# Patient Record
Sex: Female | Born: 1940 | Race: White | Hispanic: No | Smoking: Former smoker
Health system: Southern US, Community
[De-identification: ages and names within clinical notes are randomized; demographics above are authoritative.]

## PROBLEM LIST (undated history)

## (undated) DIAGNOSIS — M810 Age-related osteoporosis without current pathological fracture: Secondary | ICD-10-CM

## (undated) DIAGNOSIS — M1990 Unspecified osteoarthritis, unspecified site: Secondary | ICD-10-CM

## (undated) DIAGNOSIS — F329 Major depressive disorder, single episode, unspecified: Secondary | ICD-10-CM

## (undated) DIAGNOSIS — E785 Hyperlipidemia, unspecified: Secondary | ICD-10-CM

## (undated) DIAGNOSIS — N63 Unspecified lump in unspecified breast: Secondary | ICD-10-CM

## (undated) DIAGNOSIS — F32A Depression, unspecified: Secondary | ICD-10-CM

## (undated) DIAGNOSIS — K219 Gastro-esophageal reflux disease without esophagitis: Secondary | ICD-10-CM

## (undated) DIAGNOSIS — B019 Varicella without complication: Secondary | ICD-10-CM

## (undated) DIAGNOSIS — I10 Essential (primary) hypertension: Secondary | ICD-10-CM

## (undated) DIAGNOSIS — E119 Type 2 diabetes mellitus without complications: Secondary | ICD-10-CM

## (undated) HISTORY — PX: OTHER SURGICAL HISTORY: SHX169

## (undated) HISTORY — PX: TUBAL LIGATION: SHX77

---
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
## 1972-02-26 HISTORY — PX: ECTOPIC PREGNANCY SURGERY: SHX613

## 1982-02-25 HISTORY — PX: BREAST BIOPSY: SHX20

## 2004-08-03 ENCOUNTER — Ambulatory Visit: Payer: Self-pay | Admitting: Family Medicine

## 2005-08-20 ENCOUNTER — Ambulatory Visit: Payer: Self-pay | Admitting: General Practice

## 2006-09-05 ENCOUNTER — Ambulatory Visit: Payer: Self-pay

## 2006-10-30 ENCOUNTER — Ambulatory Visit: Payer: Self-pay | Admitting: Endocrinology

## 2007-11-25 ENCOUNTER — Ambulatory Visit: Payer: Self-pay | Admitting: Endocrinology

## 2008-11-29 ENCOUNTER — Ambulatory Visit: Payer: Self-pay | Admitting: Family Medicine

## 2009-05-18 ENCOUNTER — Ambulatory Visit: Payer: Self-pay | Admitting: Family Medicine

## 2009-12-19 ENCOUNTER — Ambulatory Visit: Payer: Self-pay | Admitting: Family Medicine

## 2010-12-21 ENCOUNTER — Ambulatory Visit: Payer: Self-pay | Admitting: Family Medicine

## 2011-12-23 ENCOUNTER — Ambulatory Visit: Payer: Self-pay | Admitting: Family Medicine

## 2011-12-26 ENCOUNTER — Ambulatory Visit: Payer: Self-pay | Admitting: Family Medicine

## 2012-12-28 ENCOUNTER — Ambulatory Visit: Payer: Self-pay | Admitting: Family Medicine

## 2012-12-28 DIAGNOSIS — B351 Tinea unguium: Secondary | ICD-10-CM

## 2013-02-28 ENCOUNTER — Emergency Department: Payer: Self-pay | Admitting: Emergency Medicine

## 2013-02-28 LAB — COMPREHENSIVE METABOLIC PANEL
Albumin: 3.3 g/dL — ABNORMAL LOW (ref 3.4–5.0)
Alkaline Phosphatase: 75 U/L
Anion Gap: 5 — ABNORMAL LOW (ref 7–16)
BILIRUBIN TOTAL: 0.3 mg/dL (ref 0.2–1.0)
BUN: 15 mg/dL (ref 7–18)
CHLORIDE: 108 mmol/L — AB (ref 98–107)
CO2: 25 mmol/L (ref 21–32)
CREATININE: 0.88 mg/dL (ref 0.60–1.30)
Calcium, Total: 8.8 mg/dL (ref 8.5–10.1)
EGFR (African American): >60
Glucose: 150 mg/dL — ABNORMAL HIGH (ref 65–99)
OSMOLALITY: 279 (ref 275–301)
POTASSIUM: 4 mmol/L (ref 3.5–5.1)
SGOT(AST): 35 U/L (ref 15–37)
SGPT (ALT): 46 U/L (ref 12–78)
Sodium: 138 mmol/L (ref 136–145)
Total Protein: 7.2 g/dL (ref 6.4–8.2)

## 2013-02-28 LAB — URINALYSIS, COMPLETE
Bacteria: NONE SEEN
Bilirubin,UR: NEGATIVE
Blood: NEGATIVE
Glucose,UR: NEGATIVE mg/dL (ref 0–75)
KETONE: NEGATIVE
Leukocyte Esterase: NEGATIVE
Nitrite: NEGATIVE
PH: 5 (ref 4.5–8.0)
PROTEIN: NEGATIVE
RBC,UR: 1 /HPF (ref 0–5)
Specific Gravity: 1.02 (ref 1.003–1.030)
Squamous Epithelial: NONE SEEN

## 2013-02-28 LAB — CBC
HCT: 39 % (ref 35.0–47.0)
HGB: 13.1 g/dL (ref 12.0–16.0)
MCH: 28.4 pg (ref 26.0–34.0)
MCHC: 33.7 g/dL (ref 32.0–36.0)
MCV: 84 fL (ref 80–100)
Platelet: 213 10*3/uL (ref 150–440)
RBC: 4.62 10*6/uL (ref 3.80–5.20)
RDW: 15.5 % — AB (ref 11.5–14.5)
WBC: 8.6 10*3/uL (ref 3.6–11.0)

## 2013-02-28 LAB — PRO B NATRIURETIC PEPTIDE: B-Type Natriuretic Peptide: 357 pg/mL — ABNORMAL HIGH (ref 0–125)

## 2013-02-28 LAB — TROPONIN I: Troponin-I: <0.02 ng/mL

## 2013-07-27 ENCOUNTER — Ambulatory Visit: Payer: Self-pay | Admitting: Gastroenterology

## 2013-07-27 HISTORY — PX: COLONOSCOPY: SHX174

## 2013-07-29 LAB — PATHOLOGY REPORT

## 2013-12-30 ENCOUNTER — Ambulatory Visit: Payer: Self-pay | Admitting: Family Medicine

## 2015-11-28 ENCOUNTER — Other Ambulatory Visit: Payer: Self-pay | Admitting: Family Medicine

## 2015-11-28 DIAGNOSIS — Z1231 Encounter for screening mammogram for malignant neoplasm of breast: Secondary | ICD-10-CM

## 2015-12-19 ENCOUNTER — Ambulatory Visit
Admission: RE | Admit: 2015-12-19 | Discharge: 2015-12-19 | Disposition: A | Discharge status: Home / Self Care | Payer: Medicare HMO | Source: Ambulatory Visit | Attending: Family Medicine | Admitting: Family Medicine

## 2015-12-19 ENCOUNTER — Encounter: Payer: Self-pay | Admitting: Radiology

## 2015-12-19 DIAGNOSIS — Z1231 Encounter for screening mammogram for malignant neoplasm of breast: Secondary | ICD-10-CM | POA: Insufficient documentation

## 2016-07-16 HISTORY — PX: OTHER SURGICAL HISTORY: SHX169

## 2016-12-20 ENCOUNTER — Other Ambulatory Visit: Payer: Self-pay | Admitting: Family Medicine

## 2016-12-20 DIAGNOSIS — Z1231 Encounter for screening mammogram for malignant neoplasm of breast: Secondary | ICD-10-CM

## 2017-01-02 ENCOUNTER — Ambulatory Visit
Admission: RE | Admit: 2017-01-02 | Discharge: 2017-01-02 | Disposition: A | Discharge status: Home / Self Care | Payer: Medicare HMO | Source: Ambulatory Visit | Attending: Family Medicine | Admitting: Family Medicine

## 2017-01-02 DIAGNOSIS — Z1231 Encounter for screening mammogram for malignant neoplasm of breast: Secondary | ICD-10-CM | POA: Diagnosis present

## 2017-12-08 ENCOUNTER — Other Ambulatory Visit: Payer: Self-pay | Admitting: Family Medicine

## 2017-12-08 DIAGNOSIS — Z1231 Encounter for screening mammogram for malignant neoplasm of breast: Secondary | ICD-10-CM

## 2018-01-07 ENCOUNTER — Ambulatory Visit
Admission: RE | Admit: 2018-01-07 | Discharge: 2018-01-07 | Disposition: A | Discharge status: Home / Self Care | Payer: Medicare Other | Source: Ambulatory Visit | Attending: Family Medicine | Admitting: Family Medicine

## 2018-01-07 DIAGNOSIS — Z1231 Encounter for screening mammogram for malignant neoplasm of breast: Secondary | ICD-10-CM | POA: Diagnosis not present

## 2018-04-20 HISTORY — PX: TOTAL KNEE ARTHROPLASTY: SHX125

## 2018-10-23 ENCOUNTER — Other Ambulatory Visit
Admission: RE | Admit: 2018-10-23 | Discharge: 2018-10-23 | Disposition: A | Discharge status: Home / Self Care | Payer: Medicare Other | Source: Ambulatory Visit | Attending: Gastroenterology | Admitting: Gastroenterology

## 2018-10-23 DIAGNOSIS — K521 Toxic gastroenteritis and colitis: Secondary | ICD-10-CM | POA: Diagnosis present

## 2018-10-23 DIAGNOSIS — R109 Unspecified abdominal pain: Secondary | ICD-10-CM | POA: Diagnosis present

## 2018-10-23 DIAGNOSIS — K529 Noninfective gastroenteritis and colitis, unspecified: Secondary | ICD-10-CM | POA: Diagnosis present

## 2018-10-23 LAB — GASTROINTESTINAL PANEL BY PCR, STOOL (REPLACES STOOL CULTURE)

## 2018-10-23 LAB — C DIFFICILE QUICK SCREEN W PCR REFLEX
C Diff antigen: NEGATIVE
C Diff interpretation: NOT DETECTED
C Diff toxin: NEGATIVE

## 2018-10-27 ENCOUNTER — Inpatient Hospital Stay: Admit: 2018-10-27 | Payer: Medicare Other

## 2018-10-29 LAB — PANCREATIC ELASTASE, FECAL

## 2018-11-03 LAB — CALPROTECTIN, FECAL: Calprotectin, Fecal: 26 ug/g (ref 0–120)

## 2018-12-01 ENCOUNTER — Other Ambulatory Visit: Payer: Self-pay | Admitting: Family Medicine

## 2018-12-01 DIAGNOSIS — Z1231 Encounter for screening mammogram for malignant neoplasm of breast: Secondary | ICD-10-CM

## 2018-12-28 ENCOUNTER — Other Ambulatory Visit: Payer: Self-pay

## 2018-12-28 ENCOUNTER — Other Ambulatory Visit
Admission: RE | Admit: 2018-12-28 | Discharge: 2018-12-28 | Disposition: A | Discharge status: Home / Self Care | Payer: Medicare Other | Source: Ambulatory Visit | Attending: Internal Medicine | Admitting: Internal Medicine

## 2018-12-28 DIAGNOSIS — Z20828 Contact with and (suspected) exposure to other viral communicable diseases: Secondary | ICD-10-CM | POA: Diagnosis not present

## 2018-12-28 DIAGNOSIS — Z01812 Encounter for preprocedural laboratory examination: Secondary | ICD-10-CM | POA: Insufficient documentation

## 2018-12-29 LAB — SARS CORONAVIRUS 2 (TAT 6-24 HRS): SARS Coronavirus 2: NEGATIVE

## 2018-12-30 ENCOUNTER — Encounter: Payer: Self-pay | Admitting: *Deleted

## 2018-12-31 ENCOUNTER — Encounter: Payer: Self-pay | Admitting: *Deleted

## 2018-12-31 ENCOUNTER — Encounter
Admission: RE | Disposition: A | Discharge status: Home / Self Care | Payer: Self-pay | Source: Home / Self Care | Attending: Internal Medicine

## 2018-12-31 ENCOUNTER — Other Ambulatory Visit: Payer: Self-pay

## 2018-12-31 ENCOUNTER — Ambulatory Visit: Payer: Medicare Other | Admitting: Certified Registered Nurse Anesthetist

## 2018-12-31 ENCOUNTER — Ambulatory Visit
Admission: RE | Admit: 2018-12-31 | Discharge: 2018-12-31 | Disposition: A | Discharge status: Home / Self Care | Payer: Medicare Other | Attending: Internal Medicine | Admitting: Internal Medicine

## 2018-12-31 DIAGNOSIS — K640 First degree hemorrhoids: Secondary | ICD-10-CM | POA: Diagnosis not present

## 2018-12-31 DIAGNOSIS — Z87891 Personal history of nicotine dependence: Secondary | ICD-10-CM | POA: Insufficient documentation

## 2018-12-31 DIAGNOSIS — Z7982 Long term (current) use of aspirin: Secondary | ICD-10-CM | POA: Diagnosis not present

## 2018-12-31 DIAGNOSIS — I10 Essential (primary) hypertension: Secondary | ICD-10-CM | POA: Diagnosis not present

## 2018-12-31 DIAGNOSIS — K219 Gastro-esophageal reflux disease without esophagitis: Secondary | ICD-10-CM | POA: Insufficient documentation

## 2018-12-31 DIAGNOSIS — F329 Major depressive disorder, single episode, unspecified: Secondary | ICD-10-CM | POA: Diagnosis not present

## 2018-12-31 DIAGNOSIS — K591 Functional diarrhea: Secondary | ICD-10-CM | POA: Insufficient documentation

## 2018-12-31 DIAGNOSIS — E785 Hyperlipidemia, unspecified: Secondary | ICD-10-CM | POA: Insufficient documentation

## 2018-12-31 DIAGNOSIS — E119 Type 2 diabetes mellitus without complications: Secondary | ICD-10-CM | POA: Insufficient documentation

## 2018-12-31 DIAGNOSIS — Z794 Long term (current) use of insulin: Secondary | ICD-10-CM | POA: Insufficient documentation

## 2018-12-31 DIAGNOSIS — Z79899 Other long term (current) drug therapy: Secondary | ICD-10-CM | POA: Diagnosis not present

## 2018-12-31 DIAGNOSIS — K5730 Diverticulosis of large intestine without perforation or abscess without bleeding: Secondary | ICD-10-CM | POA: Insufficient documentation

## 2018-12-31 DIAGNOSIS — K529 Noninfective gastroenteritis and colitis, unspecified: Secondary | ICD-10-CM | POA: Diagnosis present

## 2018-12-31 DIAGNOSIS — R159 Full incontinence of feces: Secondary | ICD-10-CM | POA: Insufficient documentation

## 2018-12-31 HISTORY — DX: Essential (primary) hypertension: I10

## 2018-12-31 HISTORY — DX: Varicella without complication: B01.9

## 2018-12-31 HISTORY — DX: Type 2 diabetes mellitus without complications: E11.9

## 2018-12-31 HISTORY — DX: Unspecified osteoarthritis, unspecified site: M19.90

## 2018-12-31 HISTORY — DX: Depression, unspecified: F32.A

## 2018-12-31 HISTORY — DX: Age-related osteoporosis without current pathological fracture: M81.0

## 2018-12-31 HISTORY — DX: Gastro-esophageal reflux disease without esophagitis: K21.9

## 2018-12-31 HISTORY — DX: Unspecified lump in unspecified breast: N63.0

## 2018-12-31 HISTORY — DX: Hyperlipidemia, unspecified: E78.5

## 2018-12-31 HISTORY — PX: COLONOSCOPY WITH PROPOFOL: SHX5780

## 2018-12-31 LAB — GLUCOSE, CAPILLARY: Glucose-Capillary: 172 mg/dL — ABNORMAL HIGH (ref 70–99)

## 2018-12-31 SURGERY — COLONOSCOPY WITH PROPOFOL
Anesthesia: General

## 2018-12-31 MED ORDER — PROPOFOL 500 MG/50ML IV EMUL
INTRAVENOUS | Status: AC
  Filled 2018-12-31: qty 50

## 2018-12-31 MED ORDER — PROPOFOL 500 MG/50ML IV EMUL
INTRAVENOUS | Status: DC | PRN
  Administered 2018-12-31: 120 ug/kg/min via INTRAVENOUS

## 2018-12-31 MED ORDER — LIDOCAINE HCL (PF) 2 % IJ SOLN
INTRAMUSCULAR | Status: AC
  Filled 2018-12-31: qty 10

## 2018-12-31 MED ORDER — LIDOCAINE HCL (CARDIAC) PF 100 MG/5ML IV SOSY
PREFILLED_SYRINGE | INTRAVENOUS | Status: DC | PRN
  Administered 2018-12-31: 50 mg via INTRAVENOUS

## 2018-12-31 MED ORDER — SODIUM CHLORIDE 0.9 % IV SOLN
INTRAVENOUS | Status: DC
  Administered 2018-12-31: 1000 mL via INTRAVENOUS

## 2018-12-31 MED ORDER — PROPOFOL 10 MG/ML IV BOLUS
INTRAVENOUS | Status: DC | PRN
  Administered 2018-12-31: 70 mg via INTRAVENOUS

## 2018-12-31 NOTE — H&P (Signed)
Outpatient short stay form Pre-procedure 12/31/2018 10:44 AM Anna Ramos, M.D.  Primary Physician: [name], M.D.  Reason for visit:  Chronic diarrhea  History of present illness: Patient with chronic functional diarrhea with negative random colon biopsies on colonoscopy in 2015. No bleeding.     Current Facility-Administered Medications:  .  0.9 %  sodium chloride infusion, , Intravenous, Continuous, [place], Benay Pike, MD, Last Rate: 20 mL/hr at 12/31/18 1033, 1,000 mL at 12/31/18 1033  Medications Prior to Admission  Medication Sig Dispense Refill Last Dose  . aspirin 81 MG EC tablet Take 81 mg by mouth daily. Swallow whole.     Marland Kitchen CALCIUM CITRATE-VITAMIN D3 PO Take by mouth 2 (two) times daily.     . Cholecalciferol (VITAMIN D-3 PO) Take 1,000 mg by mouth daily.     Marland Kitchen CINNAMON PO Take by mouth 2 (two) times daily.     . Cranberry 400 MG CAPS Take by mouth daily.     . diclofenac sodium (VOLTAREN) 1 % GEL Apply topically 4 (four) times daily.     . DULoxetine (CYMBALTA) 60 MG capsule Take 60 mg by mouth daily.     . Exenatide (BYETTA 10 MCG PEN [place]) Inject into the skin 2 (two) times daily.     Marland Kitchen gabapentin (NEURONTIN) 300 MG capsule Take 300 mg by mouth 3 (three) times daily.     Marland Kitchen glipiZIDE-metformin (METAGLIP) 5-500 MG tablet Take 1 tablet by mouth 2 (two) times daily before a meal.     . hydrochlorothiazide (MICROZIDE) 12.5 MG capsule Take 12.5 mg by mouth daily.     . insulin detemir (LEVEMIR) 100 UNIT/ML injection Inject 20 Units into the skin daily.   12/30/2018 at Unknown time  . insulin NPH-regular Human (70-30) 100 UNIT/ML injection Inject 20 Units into the skin 2 (two) times daily before a meal.     . lisinopril (ZESTRIL) 40 MG tablet Take 40 mg by mouth daily.     . Omega 3 1000 MG CAPS Take by mouth.     Marland Kitchen omeprazole (PRILOSEC) 20 MG capsule Take 20 mg by mouth daily.     . pravastatin (PRAVACHOL) 10 MG tablet Take 10 mg by mouth daily.        No Known  Allergies   Past Medical History:  Diagnosis Date  . Arthritis   . Benign breast lumps   . Chicken pox   . Depression   . Diabetes mellitus without complication ([place])   . GERD (gastroesophageal reflux disease)   . Hyperlipidemia   . Hypertension   . Osteoporosis, post-menopausal     Review of systems:  Otherwise negative.    Physical Exam  Gen: Alert, oriented. Appears stated age.  HEENT: /AT. PERRLA. Lungs: CTA, no wheezes. CV: RR nl S1, S2. Abd: soft, benign, no masses. BS+ Ext: No edema. Pulses 2+    Planned procedures: Proceed with colonoscopy. The patient understands the nature of the planned procedure, indications, risks, alternatives and potential complications including but not limited to bleeding, infection, perforation, damage to internal organs and possible oversedation/side effects from anesthesia. The patient agrees and gives consent to proceed.  Please refer to procedure notes for findings, recommendations and patient disposition/instructions.     Anna Ramos, M.D. Gastroenterology 12/31/2018  10:44 AM

## 2018-12-31 NOTE — Brief Op Note (Signed)
EKG at bedside

## 2018-12-31 NOTE — Anesthesia Preprocedure Evaluation (Addendum)
Anesthesia Evaluation  Patient identified by MRN, date of birth, ID band Patient awake    Reviewed: Allergy & Precautions, H&P , NPO status , Patient's Chart, lab work & pertinent test results, reviewed documented beta blocker date and time   History of Anesthesia Complications Negative for: history of anesthetic complications  Airway Mallampati: II  TM Distance: >3 FB Neck ROM: full    Dental  (+) Dental Advidsory Given, Partial Lower, Missing, Teeth Intact   Pulmonary neg pulmonary ROS, former smoker,    Pulmonary exam normal        Cardiovascular Exercise Tolerance: Good hypertension, (-) angina(-) Past MI and (-) Cardiac Stents Normal cardiovascular exam(-) dysrhythmias (-) Valvular Problems/Murmurs     Neuro/Psych PSYCHIATRIC DISORDERS Depression negative neurological ROS     GI/Hepatic Neg liver ROS, GERD  ,  Endo/Other  diabetes  Renal/GU negative Renal ROS  negative genitourinary   Musculoskeletal   Abdominal   Peds  Hematology negative hematology ROS (+)   Anesthesia Other Findings Past Medical History: No date: Arthritis No date: Benign breast lumps No date: Chicken pox No date: Depression No date: Diabetes mellitus without complication (HCC) No date: GERD (gastroesophageal reflux disease) No date: Hyperlipidemia No date: Hypertension No date: Osteoporosis, post-menopausal   Reproductive/Obstetrics negative OB ROS                            Anesthesia Physical Anesthesia Plan  ASA: III  Anesthesia Plan: General   Post-op Pain Management:    Induction: Intravenous  PONV Risk Score and Plan: 3 and Propofol infusion and TIVA  Airway Management Planned: Natural Airway and Nasal Cannula  Additional Equipment:   Intra-op Plan:   Post-operative Plan:   Informed Consent: I have reviewed the patients History and Physical, chart, labs and discussed the procedure  including the risks, benefits and alternatives for the proposed anesthesia with the patient or authorized representative who has indicated his/her understanding and acceptance.     Dental Advisory Given  Plan Discussed with: Anesthesiologist, CRNA and Surgeon  Anesthesia Plan Comments:         Anesthesia Quick Evaluation

## 2018-12-31 NOTE — Transfer of Care (Signed)
Immediate Anesthesia Transfer of Care Note  Patient: Anna Ramos  Procedure(s) Performed: COLONOSCOPY WITH PROPOFOL (N/A )  Patient Location: PACU and Endoscopy Unit  Anesthesia Type:General  Level of Consciousness: drowsy  Airway & Oxygen Therapy: Patient Spontanous Breathing  Post-op Assessment: Report given to RN and Post -op Vital signs reviewed and stable  Post vital signs: Reviewed and stable  Last Vitals:  Vitals Value Taken Time  BP 145/70 12/31/18 1126  Temp    Pulse 77 12/31/18 1126  Resp 18 12/31/18 1126  SpO2 99 % 12/31/18 1126  Vitals shown include unvalidated device data.  Last Pain:  Vitals:   12/31/18 1009  TempSrc: Tympanic  PainSc: 8          Complications: No apparent anesthesia complications

## 2018-12-31 NOTE — Op Note (Signed)
[facility] Gastroenterology Patient Name: Carri Spillers Procedure Date: 12/31/2018 10:52 AM MRN: [id number] Account #: [id number] Date of Birth: [birth year] Admit Type: Outpatient Age: 78 Room: [facility] ENDO ROOM 2 Gender: Female Note Status: Finalized Procedure:             Colonoscopy Indications:           Functional diarrhea, Fecal incontinence Providers:             Boykin Nearing. Norma Fredrickson MD, MD Referring MD:          Rhona Leavens. Burnett Sheng, MD (Referring MD) Medicines:             Propofol per Anesthesia Complications:         No immediate complications. Procedure:             Pre-Anesthesia Assessment:                        - The risks and benefits of the procedure and the                         sedation options and risks were discussed with the                         patient. All questions were answered and informed                         consent was obtained.                        - Patient identification and proposed procedure were                         verified prior to the procedure by the nurse. The                         procedure was verified in the procedure room.                        - ASA Grade Assessment: III - A patient with severe                         systemic disease.                        - After reviewing the risks and benefits, the patient                         was deemed in satisfactory condition to undergo the                         procedure.                        After obtaining informed consent, the colonoscope was                         passed under direct vision. Throughout the procedure,                         the patient's blood pressure, pulse,  and oxygen                         saturations were monitored continuously. The                         Colonoscope was introduced through the anus and                         advanced to the the cecum, identified by appendiceal                         orifice and ileocecal valve. The  colonoscopy was                         performed without difficulty. The patient tolerated                         the procedure well. The quality of the bowel                         preparation was good. The ileocecal valve, appendiceal                         orifice, and rectum were photographed. Findings:      The digital rectal exam findings include decreased sphincter tone.       Pertinent negatives include no palpable rectal lesions.      Many small-mouthed diverticula were found in the sigmoid colon.      Normal mucosa was found in the entire colon. Biopsies for histology were       taken with a cold forceps from the random colon for evaluation of       microscopic colitis.      Non-bleeding internal hemorrhoids were found during retroflexion. The       hemorrhoids were Grade I (internal hemorrhoids that do not prolapse).      The exam was otherwise without abnormality. Impression:            - Decreased sphincter tone found on digital rectal                         exam.                        - Diverticulosis in the sigmoid colon.                        - Normal mucosa in the entire examined colon. Biopsied.                        - Non-bleeding internal hemorrhoids.                        - The examination was otherwise normal. Recommendation:        - Patient has a contact number available for                         emergencies. The signs and symptoms of potential  delayed complications were discussed with the patient.                         Return to normal activities tomorrow. Written                         discharge instructions were provided to the patient.                        - Resume previous diet.                        - Continue present medications.                        - Await pathology results.                        - No repeat colonoscopy due to current age (68 years                         or older) and the absence of colonic  polyps.                        - Return to physician assistant in 4 weeks. Procedure Code(s):     --- Professional ---                        [phone], Colonoscopy, flexible; with biopsy, single or                         multiple Diagnosis Code(s):     --- Professional ---                        K57.30, Diverticulosis of large intestine without                         perforation or abscess without bleeding                        R15.9, Full incontinence of feces                        K59.1, Functional diarrhea                        K64.0, First degree hemorrhoids                        K62.89, Other specified diseases of anus and rectum CPT copyright 2019 American Medical Association. All rights reserved. The codes documented in this report are preliminary and upon coder review may  be revised to meet current compliance requirements. Efrain Sella MD, MD 12/31/2018 11:29:56 AM This report has been signed electronically. Number of Addenda: 0 Note Initiated On: 12/31/2018 10:52 AM Scope Withdrawal Time: 0 hours 6 minutes 28 seconds  Total Procedure Duration: 0 hours 9 minutes 9 seconds  Estimated Blood Loss:  Estimated blood loss: none.      [facility]

## 2018-12-31 NOTE — Anesthesia Post-op Follow-up Note (Signed)
Anesthesia QCDR form completed.        

## 2018-12-31 NOTE — Anesthesia Postprocedure Evaluation (Signed)
Anesthesia Post Note  Patient: Anna Ramos  Procedure(s) Performed: COLONOSCOPY WITH PROPOFOL (N/A )  Patient location during evaluation: Endoscopy Anesthesia Type: General Level of consciousness: awake and alert Pain management: pain level controlled Vital Signs Assessment: post-procedure vital signs reviewed and stable Respiratory status: spontaneous breathing, nonlabored ventilation, respiratory function stable and patient connected to nasal cannula oxygen Cardiovascular status: blood pressure returned to baseline and stable Postop Assessment: no apparent nausea or vomiting Anesthetic complications: no     Last Vitals:  Vitals:   12/31/18 1156 12/31/18 1206  BP: (!) 139/99 (!) 228/107  Pulse:    Resp:    Temp:    SpO2:      Last Pain:  Vitals:   12/31/18 1206  TempSrc:   PainSc: 0-No pain                 Martha Clan

## 2019-01-01 ENCOUNTER — Encounter: Payer: Self-pay | Admitting: Internal Medicine

## 2019-01-01 LAB — SURGICAL PATHOLOGY

## 2019-01-11 ENCOUNTER — Ambulatory Visit
Admission: RE | Admit: 2019-01-11 | Discharge: 2019-01-11 | Disposition: A | Discharge status: Home / Self Care | Payer: Medicare Other | Source: Ambulatory Visit | Attending: Family Medicine | Admitting: Family Medicine

## 2019-01-11 ENCOUNTER — Other Ambulatory Visit: Payer: Self-pay

## 2019-01-11 DIAGNOSIS — Z1231 Encounter for screening mammogram for malignant neoplasm of breast: Secondary | ICD-10-CM | POA: Insufficient documentation

## 2019-12-06 ENCOUNTER — Other Ambulatory Visit: Payer: Self-pay | Admitting: Family Medicine

## 2019-12-06 DIAGNOSIS — Z1231 Encounter for screening mammogram for malignant neoplasm of breast: Secondary | ICD-10-CM

## 2020-01-12 ENCOUNTER — Ambulatory Visit: Payer: Medicare Other

## 2020-01-18 ENCOUNTER — Encounter (INDEPENDENT_AMBULATORY_CARE_PROVIDER_SITE_OTHER): Payer: Self-pay

## 2020-01-18 ENCOUNTER — Other Ambulatory Visit: Payer: Self-pay

## 2020-01-18 ENCOUNTER — Ambulatory Visit
Admission: RE | Admit: 2020-01-18 | Discharge: 2020-01-18 | Disposition: A | Discharge status: Home / Self Care | Payer: Medicare Other | Source: Ambulatory Visit | Attending: Family Medicine | Admitting: Family Medicine

## 2020-01-18 DIAGNOSIS — Z1231 Encounter for screening mammogram for malignant neoplasm of breast: Secondary | ICD-10-CM | POA: Insufficient documentation

## 2020-10-10 ENCOUNTER — Other Ambulatory Visit
Admission: RE | Admit: 2020-10-10 | Discharge: 2020-10-10 | Disposition: A | Discharge status: Home / Self Care | Payer: Medicare Other | Source: Ambulatory Visit | Attending: Family Medicine | Admitting: Family Medicine

## 2020-10-10 ENCOUNTER — Other Ambulatory Visit: Payer: Self-pay

## 2020-10-10 DIAGNOSIS — Z20822 Contact with and (suspected) exposure to covid-19: Secondary | ICD-10-CM | POA: Insufficient documentation

## 2020-10-10 DIAGNOSIS — Z01812 Encounter for preprocedural laboratory examination: Secondary | ICD-10-CM | POA: Diagnosis present

## 2020-10-10 LAB — SARS CORONAVIRUS 2 (TAT 6-24 HRS): SARS Coronavirus 2: NEGATIVE

## 2020-10-12 ENCOUNTER — Ambulatory Visit: Payer: Medicare Other | Attending: Neurology

## 2020-10-12 DIAGNOSIS — G4733 Obstructive sleep apnea (adult) (pediatric): Secondary | ICD-10-CM | POA: Diagnosis present

## 2020-10-13 ENCOUNTER — Other Ambulatory Visit: Payer: Self-pay

## 2020-12-12 ENCOUNTER — Other Ambulatory Visit: Payer: Self-pay | Admitting: Family Medicine

## 2020-12-12 DIAGNOSIS — Z1231 Encounter for screening mammogram for malignant neoplasm of breast: Secondary | ICD-10-CM

## 2021-01-31 ENCOUNTER — Ambulatory Visit: Payer: Medicare Other

## 2021-07-19 ENCOUNTER — Ambulatory Visit
Admission: RE | Admit: 2021-07-19 | Discharge: 2021-07-19 | Disposition: A | Discharge status: Home / Self Care | Payer: Medicare Other | Source: Ambulatory Visit | Attending: Family Medicine | Admitting: Family Medicine

## 2021-07-19 DIAGNOSIS — Z1231 Encounter for screening mammogram for malignant neoplasm of breast: Secondary | ICD-10-CM | POA: Insufficient documentation

## 2022-05-13 ENCOUNTER — Encounter: Payer: Self-pay | Admitting: Urology

## 2022-05-13 ENCOUNTER — Ambulatory Visit: Payer: Medicare Other | Admitting: Urology

## 2022-05-13 VITALS — BP 144/78 | HR 82 | Ht 62.0 in | Wt 212.0 lb

## 2022-05-13 DIAGNOSIS — N3281 Overactive bladder: Secondary | ICD-10-CM

## 2022-05-13 LAB — URINALYSIS, COMPLETE
Bilirubin, UA: NEGATIVE
Glucose, UA: NEGATIVE
Ketones, UA: NEGATIVE
Nitrite, UA: POSITIVE — AB
RBC, UA: NEGATIVE
Specific Gravity, UA: 1.02 (ref 1.005–1.030)
Urobilinogen, Ur: 0.2 mg/dL (ref 0.2–1.0)
pH, UA: 5.5 (ref 5.0–7.5)

## 2022-05-13 LAB — MICROSCOPIC EXAMINATION

## 2022-05-13 MED ORDER — MIRABEGRON ER 50 MG PO TB24: 50.0000 mg | ORAL_TABLET | Freq: Every day | ORAL | 0 refills | Status: DC

## 2022-05-13 NOTE — Progress Notes (Signed)
05/13/2022 1:51 PM   Anna Ramos 1941/02/02 [id number]  Referring provider: [name], MD [address] [facility] [place],  [place] [address]  Chief Complaint  Patient presents with   New Patient (Initial Visit)   Over Active Bladder    HPI: I was consulted to assess the patient's urinary incontinence.  She leaks with coughing sneezing bending and lifting.  She has urge incontinence.  She does not have bedwetting but describes foot on the floor syndrome wearing 4-5 pads a day moderately wet.  She was being followed by urology at [facility] and her last appointment was near November of last year  She voids every 90 minutes both day and night and often has a good flow  She describes possible bladder cancer but then was not clear in this diagnosis.  She then describes bladder chemotherapy 2 or 3 years ago  She thinks she gets 6 bladder infections a year with pain and chills that respond favorably antibiotics.  She is a insulin-dependent diabetic.  No hysterectomy.  Did not see any bladder medication on her list and the history was not clear  She uses a walker  Spent quite a bit of time looking at the [facility] records and oncology a diagnosis said she has had bladder cancer.  She has had positive urine cultures when she saw urology in 2019.  I could not find good urology notes documenting her cancer.  Bowel movements normal.  No bladder surgery kidney stones.   PMH: Past Medical History:  Diagnosis Date   Arthritis    Benign breast lumps    Chicken pox    Depression    Diabetes mellitus without complication (HCC)    GERD (gastroesophageal reflux disease)    Hyperlipidemia    Hypertension    Osteoporosis, post-menopausal     Surgical History: Past Surgical History:  Procedure Laterality Date   benign breast lump removals     BREAST BIOPSY Bilateral 1984   benign cysts   COLONOSCOPY  07/27/2013   COLONOSCOPY WITH PROPOFOL N/A 12/31/2018    Procedure: COLONOSCOPY WITH PROPOFOL;  Surgeon: [place], Benay Pike, MD;  Location: [facility] ENDOSCOPY;  Service: Gastroenterology;  Laterality: N/A;   ECTOPIC PREGNANCY SURGERY  1974   open reduction tibial shaft fracture  07/16/2016   replacement total knee with resurfacing     TOTAL KNEE ARTHROPLASTY Left 04/20/2018   TUBAL LIGATION      Home Medications:  Allergies as of 05/13/2022   No Known Allergies      Medication List        Accurate as of May 13, 2022  1:51 PM. If you have any questions, ask your nurse or doctor.          aspirin EC 81 MG tablet Take 81 mg by mouth daily. Swallow whole.   BYETTA 10 MCG PEN [place] Inject into the skin 2 (two) times daily.   CALCIUM CITRATE-VITAMIN D3 PO Take by mouth 2 (two) times daily.   CINNAMON PO Take by mouth 2 (two) times daily.   Cranberry 400 MG Caps Take by mouth daily.   diclofenac sodium 1 % Gel Commonly known as: VOLTAREN Apply topically 4 (four) times daily.   DULoxetine 60 MG capsule Commonly known as: CYMBALTA Take 60 mg by mouth daily.   gabapentin 300 MG capsule Commonly known as: NEURONTIN Take 300 mg by mouth 3 (three) times daily.   glipiZIDE-metformin 5-500 MG tablet Commonly known as: METAGLIP Take 1  tablet by mouth 2 (two) times daily before a meal.   hydrochlorothiazide 12.5 MG capsule Commonly known as: MICROZIDE Take 12.5 mg by mouth daily.   insulin detemir 100 UNIT/ML injection Commonly known as: LEVEMIR Inject 20 Units into the skin daily.   insulin NPH-regular Human (70-30) 100 UNIT/ML injection Inject 20 Units into the skin 2 (two) times daily before a meal.   lisinopril 40 MG tablet Commonly known as: ZESTRIL Take 40 mg by mouth daily.   Omega 3 1000 MG Caps Take by mouth.   omeprazole 20 MG capsule Commonly known as: PRILOSEC Take 20 mg by mouth daily.   pravastatin 10 MG tablet Commonly known as: PRAVACHOL Take 10 mg by mouth daily.   VITAMIN D-3 PO Take 1,000 mg by  mouth daily.        Allergies: No Known Allergies  Family History: Family History  Problem Relation Age of Onset   Breast cancer Sister 10   Hypertension Sister    Diabetes Sister    Liver cancer Mother    Osteoporosis Mother    Lupus Mother    Hypertension Father    Diabetes Father    Congenital heart disease Father    Colon polyps Brother    Diabetes Brother    Stroke Brother    Diabetes Maternal Grandmother    Prostate cancer Maternal Grandfather    Diabetes Paternal Grandmother    Heart attack Paternal Grandmother    Diabetes Paternal Grandfather     Social History:  reports that she quit smoking about 4 years ago. Her smoking use included cigarettes. She has a 50.00 pack-year smoking history. She has been exposed to tobacco smoke. She has never used smokeless tobacco. She reports that she does not currently use alcohol. She reports that she does not use drugs.  ROS:                                        Physical Exam: BP (!) 144/78   Pulse 82   Ht 5\' 2"  (1.575 m)   Wt 96.2 kg   BMI 38.78 kg/m   Constitutional:  Alert and oriented, No acute distress. HEENT: [place] AT, moist mucus membranes.  Trachea midline, no masses.   Laboratory Data: Lab Results  Component Value Date   WBC 8.6 02/28/2013   HGB 13.1 02/28/2013   HCT 39.0 02/28/2013   MCV 84 02/28/2013   PLT 213 02/28/2013    Lab Results  Component Value Date   CREATININE 0.88 02/28/2013    No results found for: "PSA"  No results found for: "TESTOSTERONE"  No results found for: "HGBA1C"  Urinalysis    Component Value Date/Time   COLORURINE Yellow 02/28/2013 1606   APPEARANCEUR Clear 02/28/2013 1606   LABSPEC 1.020 02/28/2013 1606   PHURINE 5.0 02/28/2013 1606   GLUCOSEU Negative 02/28/2013 1606   HGBUR Negative 02/28/2013 1606   BILIRUBINUR Negative 02/28/2013 1606   KETONESUR Negative 02/28/2013 1606   PROTEINUR Negative 02/28/2013 1606   NITRITE Negative  02/28/2013 1606   LEUKOCYTESUR Negative 02/28/2013 1606    Pertinent Imaging:   Assessment & Plan: The patient has mixed urinary incontinence but based on the history 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  I am not sure why I could not find notes from [place] but I really could not get any clarity on this  today.  Ideally it would be best go back to [facility] for continuity of care and for safety reasons and this was emphasized to the patient.  She said she has transportation issues and I totally understand and offered for her then to see Dr. Erlene Quan again to take over her potential cancer situation here in this clinic.  The conversation became quite circular.  I would not be surprised if she gets a lot of bladder infections and mentions seeing infectious disease in the past as well.  I will see her back on Myrbetriq 50 mg samples and prescription in combination with the oxybutynin that she is taking in retrospect.  Call if culture positive.  Hopefully if she goes to do she can get a good summary or clearance in terms of cancer form [facility]. Recognizing that other physicians may be examining her I held off on cystoscopy for now.  If she has had bladder cancer cystoscopy should be done by her [place] or perhaps Dr. Erlene Quan in the future with sterilization of the urine prior  1. OAB (overactive bladder)  - Urinalysis, Complete   No follow-ups on file.  Reece Packer, MD  [facility] [address] [place], [place] [address] [phone]

## 2022-06-04 ENCOUNTER — Other Ambulatory Visit: Payer: Self-pay | Admitting: Family Medicine

## 2022-06-04 MED ORDER — MIRABEGRON ER 50 MG PO TB24: 50.0000 mg | ORAL_TABLET | Freq: Every day | ORAL | 3 refills | Status: DC

## 2022-06-04 NOTE — Telephone Encounter (Signed)
Patient called and wants Myrbetriq sent to pharmacy. RX sent.

## 2022-06-11 IMAGING — MG MM DIGITAL SCREENING BILAT W/ TOMO AND CAD
6 of 10 series · 6 of 30 positions shown · non-contrast
Comparison: Previous exam(s).

ACR Breast Density Category a: The breast tissue is almost entirely
fatty.

CLINICAL DATA: Screening.

EXAM:
DIGITAL SCREENING BILATERAL MAMMOGRAM WITH TOMOSYNTHESIS AND CAD
TECHNIQUE: Bilateral screening digital craniocaudal and mediolateral oblique
mammograms were obtained. Bilateral screening digital breast
tomosynthesis was performed. The images were evaluated with
computer-aided detection.

[R CC synth-2D]
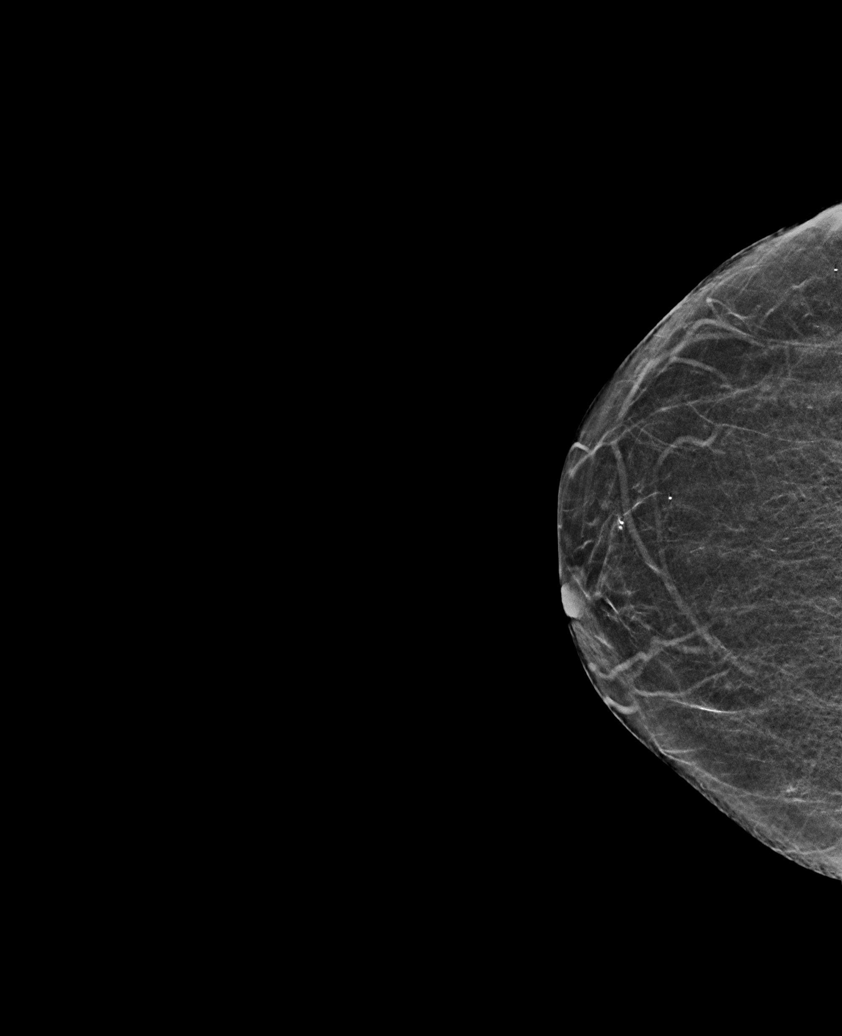

[L CC synth-2D]
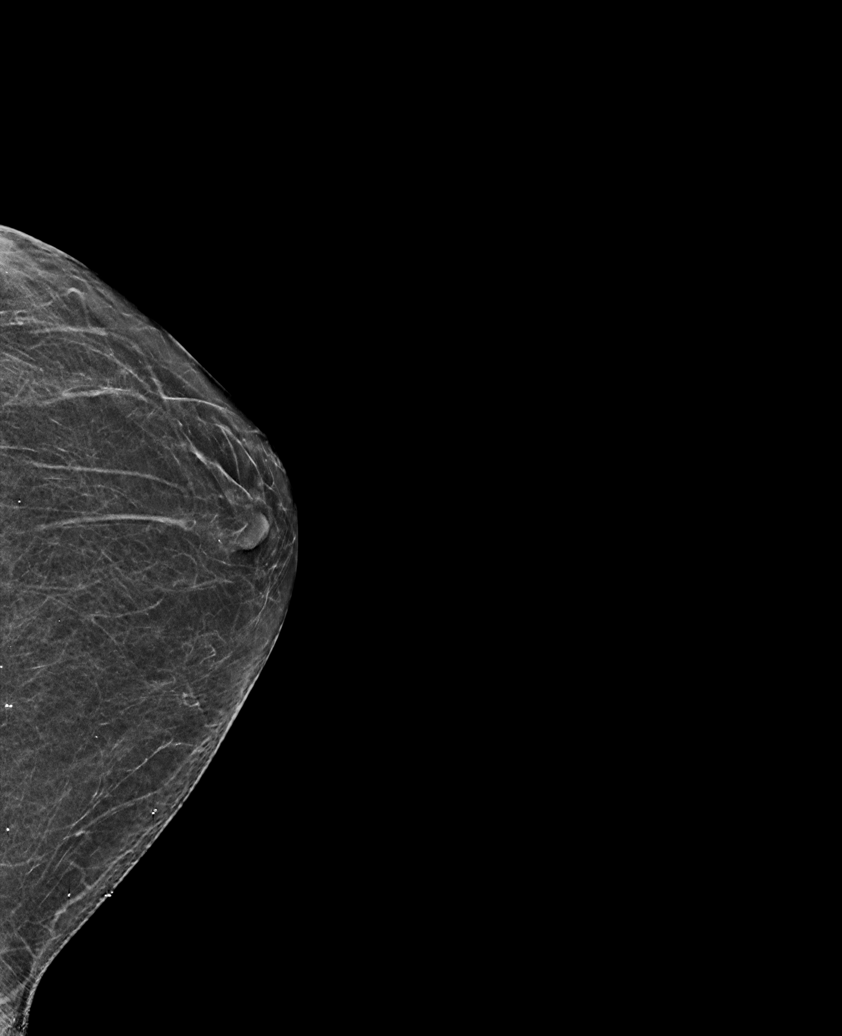

[L MLO synth-2D (1 of 2)]
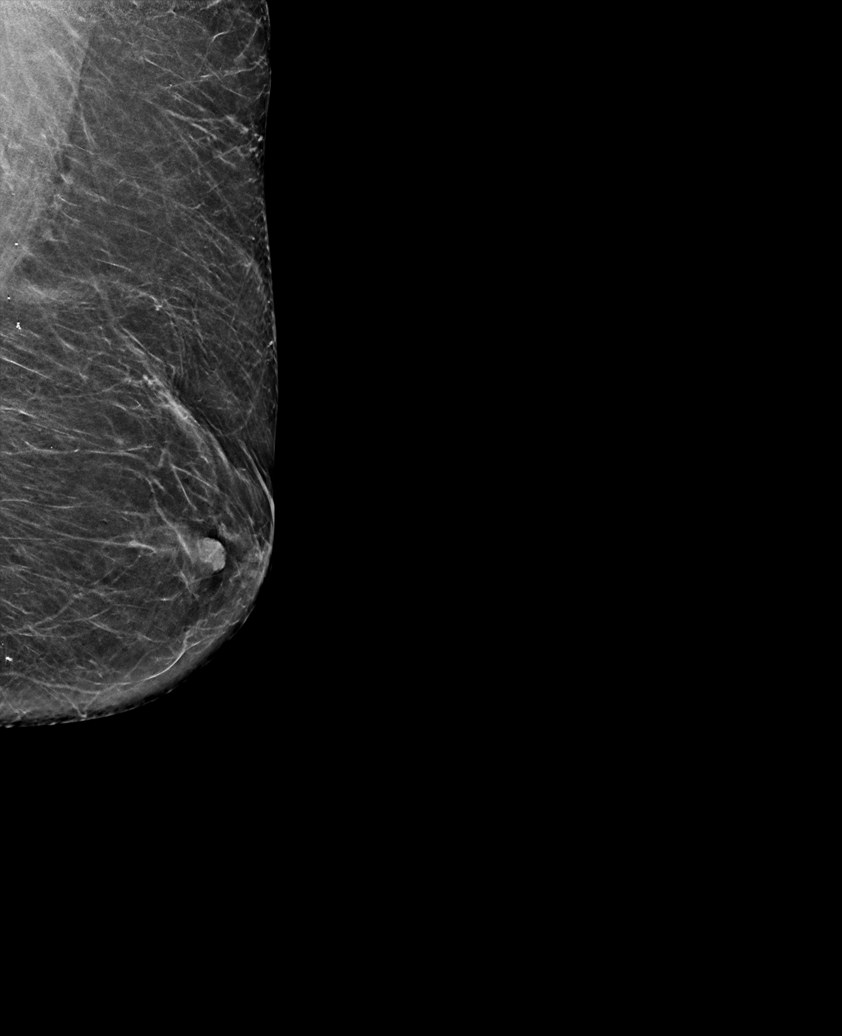

[R MLO synth-2D]
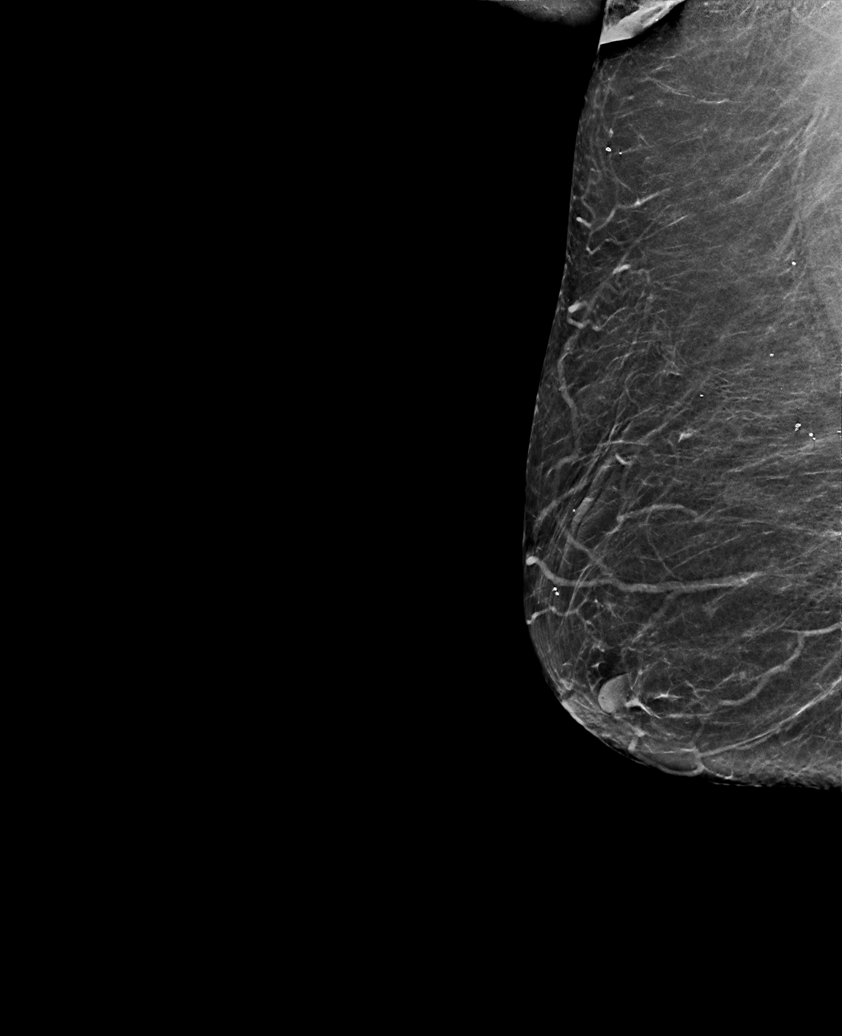

[L MLO synth-2D (2 of 2)]
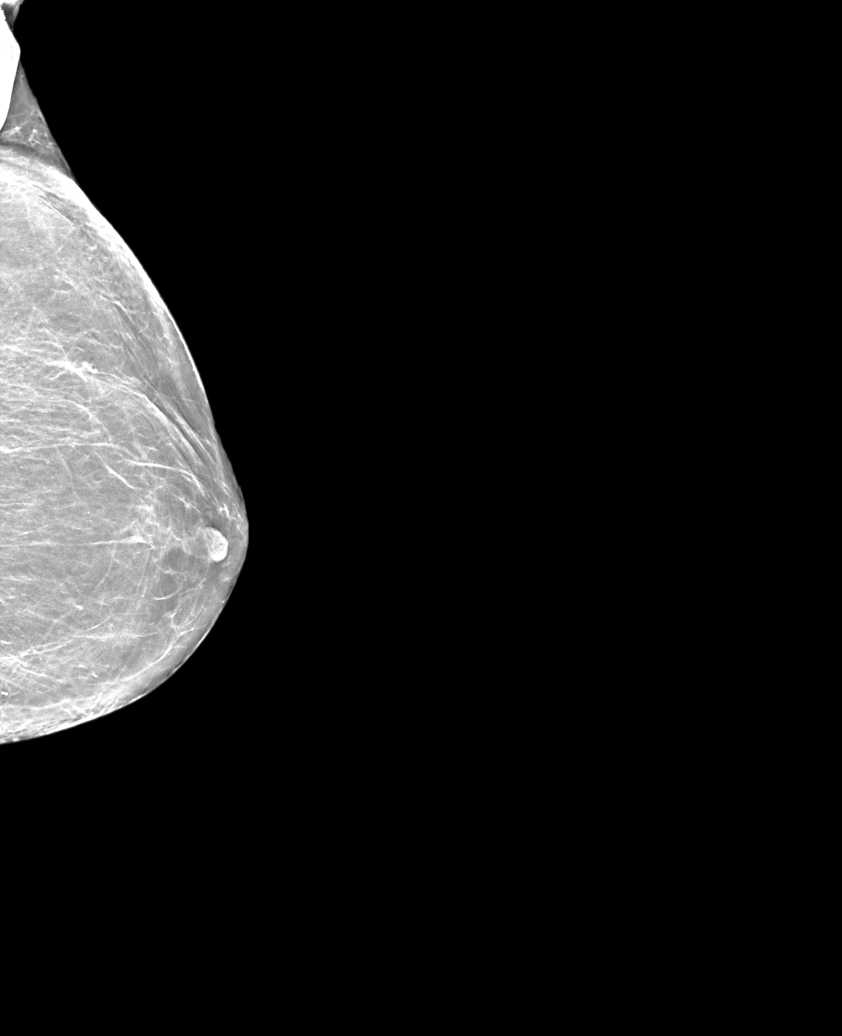

[R CC tomo · tomo slice 25/50.0]
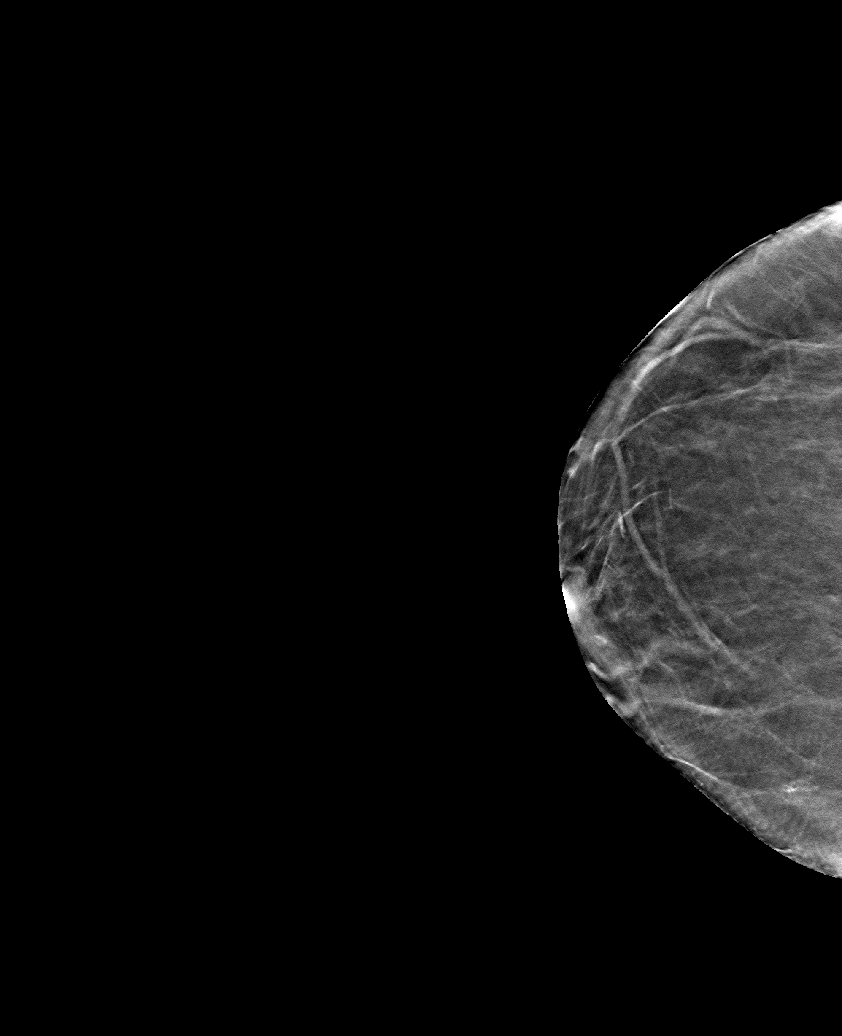

[6 of 30 positions shown; findings below may reference images not displayed]

FINDINGS: There are no findings suspicious for malignancy.
IMPRESSION: No mammographic evidence of malignancy. A result letter of this
screening mammogram will be mailed directly to the patient.

RECOMMENDATION:
Screening mammogram in one year. (Code:0E-3-N98)

BI-RADS CATEGORY  1: Negative.

## 2022-07-08 ENCOUNTER — Ambulatory Visit: Payer: Medicare Other | Admitting: Urology

## 2022-07-08 ENCOUNTER — Encounter: Payer: Self-pay | Admitting: Urology

## 2022-07-08 VITALS — BP 166/84 | HR 88 | Ht 62.0 in | Wt 215.0 lb

## 2022-07-08 DIAGNOSIS — N3281 Overactive bladder: Secondary | ICD-10-CM | POA: Diagnosis not present

## 2022-07-08 LAB — URINALYSIS, COMPLETE
Bilirubin, UA: NEGATIVE
Glucose, UA: NEGATIVE
Ketones, UA: NEGATIVE
Nitrite, UA: POSITIVE — AB
Specific Gravity, UA: 1.025 (ref 1.005–1.030)
Urobilinogen, Ur: 0.2 mg/dL (ref 0.2–1.0)
pH, UA: 5.5 (ref 5.0–7.5)

## 2022-07-08 LAB — MICROSCOPIC EXAMINATION: WBC, UA: >30 /hpf — AB (ref 0–5)

## 2022-07-08 MED ORDER — SOLIFENACIN SUCCINATE 5 MG PO TABS: 5.0000 mg | ORAL_TABLET | Freq: Every day | ORAL | 11 refills | Status: DC

## 2022-07-08 NOTE — Progress Notes (Signed)
07/08/2022 1:05 PM   Wafa Egert October 21, 1940 [id number]  Referring provider: Jerl Mina, MD [address] [facility] [place],  [place] [address]  Chief Complaint  Patient presents with   Follow-up   Over Active Bladder    6 week follow-up    HPI: I was consulted to assess the patient's urinary incontinence.  She leaks with coughing sneezing bending and lifting.  She has urge incontinence.  She does not have bedwetting but describes foot on the floor syndrome wearing 4-5 pads a day moderately wet.   She was being followed by urology at [facility] and her last appointment was near November of last year   She voids every 90 minutes both day and night and often has a good flow   She describes possible bladder cancer but then was not clear in this diagnosis.  She then describes bladder chemotherapy 2 or 3 years ago   She thinks she gets 6 bladder infections a year with pain and chills that respond favorably antibiotics.   She is a insulin-dependent diabetic.  No hysterectomy.  Did not see any bladder medication on her list and the history was not clear   She uses a walker   Spent quite a bit of time looking at the [facility] medical records and oncology a diagnosis said she has had bladder cancer.  She has had positive urine cultures when she saw urology in 2019.  I could not find good urology notes documenting her cancer.    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  I am not sure why I could not find notes from [facility] but I really could not get any clarity on this today.  Ideally it would be best go back to [facility] for continuity of care and for safety reasons and this was emphasized to the patient.  She said she has transportation issues and I totally understand and offered for her then to see Dr. Apolinar Junes again to take over her potential cancer situation here in this clinic.  The  conversation became quite circular.  I would not be surprised if she gets a lot of bladder infections and mentions seeing infectious disease in the past as well.  I will see her back on Myrbetriq 50 mg samples and prescription in combination with the oxybutynin that she is taking in retrospect.  Call if culture positive.  Hopefully if she goes to do she can get a good summary or clearance in terms of cancer form [facility]. Recognizing that other physicians may be examining her I held off on cystoscopy for now.  If she has had bladder cancer cystoscopy should be done by her [facility] physician or perhaps Dr. Apolinar Junes in the future with sterilization of the urine prior   Today Patient was nearly dry on the Myrbetriq in combination with oxybutynin but it was $800.  Now she is leaking again.  Clinically not infected.  She states she cannot go back to [facility] for bladder cancer checkups     PMH: Past Medical History:  Diagnosis Date   Arthritis    Benign breast lumps    Chicken pox    Depression    Diabetes mellitus without complication (HCC)    GERD (gastroesophageal reflux disease)    Hyperlipidemia    Hypertension    Osteoporosis, post-menopausal     Surgical History: Past Surgical History:  Procedure Laterality Date  benign breast lump removals     BREAST BIOPSY Bilateral 1984   benign cysts   COLONOSCOPY  07/27/2013   COLONOSCOPY WITH PROPOFOL N/A 12/31/2018   Procedure: COLONOSCOPY WITH PROPOFOL;  Surgeon: Toledo, Boykin Nearing, MD;  Location: [facility] ENDOSCOPY;  Service: Gastroenterology;  Laterality: N/A;   ECTOPIC PREGNANCY SURGERY  1974   open reduction tibial shaft fracture  07/16/2016   replacement total knee with resurfacing     TOTAL KNEE ARTHROPLASTY Left 04/20/2018   TUBAL LIGATION      Home Medications:  Allergies as of 07/08/2022   No Known Allergies      Medication List        Accurate as of Jul 08, 2022  1:05 PM. If you have any questions, ask your nurse or doctor.           aspirin EC 81 MG tablet Take 81 mg by mouth daily. Swallow whole.   BYETTA 10 MCG PEN [place] Inject into the skin 2 (two) times daily.   CALCIUM CITRATE-VITAMIN D3 PO Take by mouth 2 (two) times daily.   CINNAMON PO Take by mouth 2 (two) times daily.   Cranberry 400 MG Caps Take by mouth daily.   diclofenac sodium 1 % Gel Commonly known as: VOLTAREN Apply topically 4 (four) times daily.   DULoxetine 60 MG capsule Commonly known as: CYMBALTA Take 60 mg by mouth daily.   gabapentin 300 MG capsule Commonly known as: NEURONTIN Take 300 mg by mouth 3 (three) times daily.   glipiZIDE-metformin 5-500 MG tablet Commonly known as: METAGLIP Take 1 tablet by mouth 2 (two) times daily before a meal.   hydrochlorothiazide 12.5 MG capsule Commonly known as: MICROZIDE Take 12.5 mg by mouth daily.   insulin detemir 100 UNIT/ML injection Commonly known as: LEVEMIR Inject 20 Units into the skin daily.   insulin NPH-regular Human (70-30) 100 UNIT/ML injection Inject 20 Units into the skin 2 (two) times daily before a meal.   lisinopril 40 MG tablet Commonly known as: ZESTRIL Take 40 mg by mouth daily.   mirabegron ER 50 MG Tb24 tablet Commonly known as: Myrbetriq Take 1 tablet (50 mg total) by mouth daily.   Omega 3 1000 MG Caps Take by mouth.   omeprazole 20 MG capsule Commonly known as: PRILOSEC Take 20 mg by mouth daily.   pravastatin 10 MG tablet Commonly known as: PRAVACHOL Take 10 mg by mouth daily.   VITAMIN D-3 PO Take 1,000 mg by mouth daily.        Allergies: No Known Allergies  Family History: Family History  Problem Relation Age of Onset   Breast cancer Sister 35   Hypertension Sister    Diabetes Sister    Liver cancer Mother    Osteoporosis Mother    Lupus Mother    Hypertension Father    Diabetes Father    Congenital heart disease Father    Colon polyps Brother    Diabetes Brother    Stroke Brother    Diabetes Maternal Grandmother     Prostate cancer Maternal Grandfather    Diabetes Paternal Grandmother    Heart attack Paternal Grandmother    Diabetes Paternal Grandfather     Social History:  reports that she quit smoking about 4 years ago. Her smoking use included cigarettes. She has a 50.00 pack-year smoking history. She has been exposed to tobacco smoke. She has never used smokeless tobacco. She reports that she does not currently use alcohol. She reports that  she does not use drugs.  ROS:                                        Physical Exam: BP (!) 166/84   Pulse 88   Ht 5\' 2"  (1.575 m)   Wt 97.5 kg   BMI 39.32 kg/m   Constitutional:  Alert and oriented, No acute distress. HEENT: [place] AT, moist mucus membranes.  Trachea midline, no masses.   Laboratory Data: Lab Results  Component Value Date   WBC 8.6 02/28/2013   HGB 13.1 02/28/2013   HCT 39.0 02/28/2013   MCV 84 02/28/2013   PLT 213 02/28/2013    Lab Results  Component Value Date   CREATININE 0.88 02/28/2013    No results found for: "PSA"  No results found for: "TESTOSTERONE"  No results found for: "HGBA1C"  Urinalysis    Component Value Date/Time   COLORURINE Yellow 02/28/2013 1606   APPEARANCEUR Hazy (A) 05/13/2022 1349   LABSPEC 1.020 02/28/2013 1606   PHURINE 5.0 02/28/2013 1606   GLUCOSEU Negative 05/13/2022 1349   GLUCOSEU Negative 02/28/2013 1606   HGBUR Negative 02/28/2013 1606   BILIRUBINUR Negative 05/13/2022 1349   BILIRUBINUR Negative 02/28/2013 1606   KETONESUR Negative 02/28/2013 1606   PROTEINUR 3+ (A) 05/13/2022 1349   PROTEINUR Negative 02/28/2013 1606   NITRITE Positive (A) 05/13/2022 1349   NITRITE Negative 02/28/2013 1606   LEUKOCYTESUR Trace (A) 05/13/2022 1349   LEUKOCYTESUR Negative 02/28/2013 1606    Pertinent Imaging:   Assessment & Plan: Reassess in 6 weeks on Vesicare 5 mg 30 x 11 in combination with the oxybutynin.  If she does very well I can always stop the oxybutynin.  See  Dr. Apolinar Junes for ongoing care for superficial bladder cancer  1. OAB (overactive bladder)  - Urinalysis, Complete   No follow-ups on file.  Martina Sinner, MD  [facility] [address], [address] [place], [place] [address] [phone]

## 2022-07-08 NOTE — Addendum Note (Signed)
Addended by: Sueanne Margarita on: 07/08/2022 01:46 PM   Modules accepted: Orders

## 2022-07-11 LAB — CULTURE, URINE COMPREHENSIVE

## 2022-07-12 ENCOUNTER — Telehealth: Payer: Self-pay

## 2022-07-12 MED ORDER — NITROFURANTOIN MACROCRYSTAL 100 MG PO CAPS: 100.0000 mg | ORAL_CAPSULE | Freq: Two times a day (BID) | ORAL | 0 refills | Status: DC

## 2022-07-12 NOTE — Telephone Encounter (Signed)
-----   Message from Alfredo Martinez, MD sent at 07/12/2022  8:32 AM EDT ----- Macrodantin 100 mg bid for 7 days ----- Message ----- From: Consuella Lose, CMA Sent: 07/11/2022   4:41 PM EDT To: Alfredo Martinez, MD   ----- Message ----- From: Interface, [facility] Lab Results In Sent: 07/08/2022   1:36 PM EDT To: Jennette Kettle Clinical

## 2022-07-12 NOTE — Telephone Encounter (Signed)
Patient advised and RX sent in. 

## 2022-08-14 ENCOUNTER — Ambulatory Visit: Payer: Medicare Other | Admitting: Urology

## 2022-08-14 VITALS — BP 142/79 | HR 69 | Ht 62.0 in | Wt 212.5 lb

## 2022-08-14 DIAGNOSIS — Z87440 Personal history of urinary (tract) infections: Secondary | ICD-10-CM | POA: Diagnosis not present

## 2022-08-14 DIAGNOSIS — N3281 Overactive bladder: Secondary | ICD-10-CM | POA: Diagnosis not present

## 2022-08-14 NOTE — Progress Notes (Signed)
Anna Ramos presents for an office/procedure visit. BP today is 150/74. She is complaint with BP medication. Greater than 140/90. Provider  notified. Pt advised to follow up with PCP. Pt voiced understanding.
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
## 2022-09-02 ENCOUNTER — Ambulatory Visit: Payer: Medicare Other | Admitting: Urology

## 2022-09-09 ENCOUNTER — Other Ambulatory Visit: Payer: Self-pay | Admitting: Family Medicine

## 2022-09-09 DIAGNOSIS — Z1231 Encounter for screening mammogram for malignant neoplasm of breast: Secondary | ICD-10-CM

## 2022-09-23 ENCOUNTER — Ambulatory Visit: Payer: Medicare Other | Admitting: Urology

## 2022-09-23 ENCOUNTER — Encounter: Payer: Self-pay | Admitting: Urology

## 2022-09-23 DIAGNOSIS — N3020 Other chronic cystitis without hematuria: Secondary | ICD-10-CM

## 2022-09-23 DIAGNOSIS — N3281 Overactive bladder: Secondary | ICD-10-CM

## 2022-09-23 MED ORDER — TRIMETHOPRIM 100 MG PO TABS: 100.0000 mg | ORAL_TABLET | Freq: Every day | ORAL | 11 refills | Status: DC
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
## 2022-10-01 ENCOUNTER — Ambulatory Visit
Admission: RE | Admit: 2022-10-01 | Discharge: 2022-10-01 | Disposition: A | Discharge status: Home / Self Care | Payer: Medicare Other | Source: Ambulatory Visit | Attending: Family Medicine | Admitting: Family Medicine

## 2022-10-01 DIAGNOSIS — Z1231 Encounter for screening mammogram for malignant neoplasm of breast: Secondary | ICD-10-CM | POA: Insufficient documentation

## 2022-11-29 DIAGNOSIS — M069 Rheumatoid arthritis, unspecified: Secondary | ICD-10-CM | POA: Diagnosis not present

## 2022-11-29 DIAGNOSIS — M810 Age-related osteoporosis without current pathological fracture: Secondary | ICD-10-CM | POA: Diagnosis not present

## 2022-11-29 DIAGNOSIS — E1142 Type 2 diabetes mellitus with diabetic polyneuropathy: Secondary | ICD-10-CM | POA: Diagnosis not present

## 2022-11-29 DIAGNOSIS — M1990 Unspecified osteoarthritis, unspecified site: Secondary | ICD-10-CM | POA: Diagnosis not present

## 2022-11-29 DIAGNOSIS — M47812 Spondylosis without myelopathy or radiculopathy, cervical region: Secondary | ICD-10-CM | POA: Diagnosis not present

## 2022-11-29 DIAGNOSIS — I10 Essential (primary) hypertension: Secondary | ICD-10-CM | POA: Diagnosis not present

## 2022-11-29 DIAGNOSIS — F32A Depression, unspecified: Secondary | ICD-10-CM | POA: Diagnosis not present

## 2022-11-29 DIAGNOSIS — E538 Deficiency of other specified B group vitamins: Secondary | ICD-10-CM | POA: Diagnosis not present

## 2022-11-29 DIAGNOSIS — E7849 Other hyperlipidemia: Secondary | ICD-10-CM | POA: Diagnosis not present

## 2022-12-04 DIAGNOSIS — M069 Rheumatoid arthritis, unspecified: Secondary | ICD-10-CM | POA: Diagnosis not present

## 2022-12-04 DIAGNOSIS — E1142 Type 2 diabetes mellitus with diabetic polyneuropathy: Secondary | ICD-10-CM | POA: Diagnosis not present

## 2022-12-04 DIAGNOSIS — E538 Deficiency of other specified B group vitamins: Secondary | ICD-10-CM | POA: Diagnosis not present

## 2022-12-04 DIAGNOSIS — M47812 Spondylosis without myelopathy or radiculopathy, cervical region: Secondary | ICD-10-CM | POA: Diagnosis not present

## 2022-12-04 DIAGNOSIS — M810 Age-related osteoporosis without current pathological fracture: Secondary | ICD-10-CM | POA: Diagnosis not present

## 2022-12-04 DIAGNOSIS — E7849 Other hyperlipidemia: Secondary | ICD-10-CM | POA: Diagnosis not present

## 2022-12-04 DIAGNOSIS — I10 Essential (primary) hypertension: Secondary | ICD-10-CM | POA: Diagnosis not present

## 2022-12-04 DIAGNOSIS — M1990 Unspecified osteoarthritis, unspecified site: Secondary | ICD-10-CM | POA: Diagnosis not present

## 2022-12-04 DIAGNOSIS — F32A Depression, unspecified: Secondary | ICD-10-CM | POA: Diagnosis not present

## 2022-12-11 DIAGNOSIS — E538 Deficiency of other specified B group vitamins: Secondary | ICD-10-CM | POA: Diagnosis not present

## 2022-12-11 DIAGNOSIS — M1990 Unspecified osteoarthritis, unspecified site: Secondary | ICD-10-CM | POA: Diagnosis not present

## 2022-12-11 DIAGNOSIS — M810 Age-related osteoporosis without current pathological fracture: Secondary | ICD-10-CM | POA: Diagnosis not present

## 2022-12-11 DIAGNOSIS — M069 Rheumatoid arthritis, unspecified: Secondary | ICD-10-CM | POA: Diagnosis not present

## 2022-12-11 DIAGNOSIS — F32A Depression, unspecified: Secondary | ICD-10-CM | POA: Diagnosis not present

## 2022-12-11 DIAGNOSIS — M47812 Spondylosis without myelopathy or radiculopathy, cervical region: Secondary | ICD-10-CM | POA: Diagnosis not present

## 2022-12-11 DIAGNOSIS — I10 Essential (primary) hypertension: Secondary | ICD-10-CM | POA: Diagnosis not present

## 2022-12-11 DIAGNOSIS — E1142 Type 2 diabetes mellitus with diabetic polyneuropathy: Secondary | ICD-10-CM | POA: Diagnosis not present

## 2022-12-11 DIAGNOSIS — E7849 Other hyperlipidemia: Secondary | ICD-10-CM | POA: Diagnosis not present

## 2022-12-18 DIAGNOSIS — I10 Essential (primary) hypertension: Secondary | ICD-10-CM | POA: Diagnosis not present

## 2022-12-18 DIAGNOSIS — E7849 Other hyperlipidemia: Secondary | ICD-10-CM | POA: Diagnosis not present

## 2022-12-18 DIAGNOSIS — M810 Age-related osteoporosis without current pathological fracture: Secondary | ICD-10-CM | POA: Diagnosis not present

## 2022-12-18 DIAGNOSIS — E1142 Type 2 diabetes mellitus with diabetic polyneuropathy: Secondary | ICD-10-CM | POA: Diagnosis not present

## 2022-12-18 DIAGNOSIS — M069 Rheumatoid arthritis, unspecified: Secondary | ICD-10-CM | POA: Diagnosis not present

## 2022-12-18 DIAGNOSIS — E538 Deficiency of other specified B group vitamins: Secondary | ICD-10-CM | POA: Diagnosis not present

## 2022-12-18 DIAGNOSIS — M47812 Spondylosis without myelopathy or radiculopathy, cervical region: Secondary | ICD-10-CM | POA: Diagnosis not present

## 2022-12-18 DIAGNOSIS — F32A Depression, unspecified: Secondary | ICD-10-CM | POA: Diagnosis not present

## 2022-12-18 DIAGNOSIS — M1990 Unspecified osteoarthritis, unspecified site: Secondary | ICD-10-CM | POA: Diagnosis not present

## 2022-12-23 ENCOUNTER — Encounter: Payer: Self-pay | Admitting: Urology

## 2022-12-23 ENCOUNTER — Ambulatory Visit: Payer: Medicare HMO | Admitting: Urology

## 2022-12-23 VITALS — BP 145/80 | HR 73 | Ht 60.0 in | Wt 220.0 lb

## 2022-12-23 DIAGNOSIS — N3020 Other chronic cystitis without hematuria: Secondary | ICD-10-CM

## 2022-12-23 DIAGNOSIS — N3941 Urge incontinence: Secondary | ICD-10-CM | POA: Diagnosis not present

## 2022-12-23 LAB — MICROSCOPIC EXAMINATION: WBC, UA: >30 /[HPF] — AB (ref 0–5)

## 2022-12-23 LAB — URINALYSIS, COMPLETE
Bilirubin, UA: NEGATIVE
Glucose, UA: NEGATIVE
Ketones, UA: NEGATIVE
Nitrite, UA: NEGATIVE
Specific Gravity, UA: 1.025 (ref 1.005–1.030)
Urobilinogen, Ur: 0.2 mg/dL (ref 0.2–1.0)
pH, UA: 5.5 (ref 5.0–7.5)

## 2022-12-23 MED ORDER — SOLIFENACIN SUCCINATE 5 MG PO TABS: 5.0000 mg | ORAL_TABLET | Freq: Every day | ORAL | 3 refills | Status: DC

## 2022-12-23 MED ORDER — TRIMETHOPRIM 100 MG PO TABS: 100.0000 mg | ORAL_TABLET | Freq: Every day | ORAL | 3 refills | Status: DC

## 2022-12-23 MED ORDER — OXYBUTYNIN CHLORIDE ER 10 MG PO TB24: 10.0000 mg | ORAL_TABLET | Freq: Every day | ORAL | 3 refills | Status: DC

## 2022-12-23 NOTE — Addendum Note (Signed)
Addended by: Sueanne Margarita on: 12/23/2022 01:28 PM   Modules accepted: Orders
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
## 2022-12-26 LAB — CULTURE, URINE COMPREHENSIVE

## 2022-12-27 DIAGNOSIS — E7849 Other hyperlipidemia: Secondary | ICD-10-CM | POA: Diagnosis not present

## 2022-12-27 DIAGNOSIS — M069 Rheumatoid arthritis, unspecified: Secondary | ICD-10-CM | POA: Diagnosis not present

## 2022-12-27 DIAGNOSIS — E538 Deficiency of other specified B group vitamins: Secondary | ICD-10-CM | POA: Diagnosis not present

## 2022-12-27 DIAGNOSIS — F32A Depression, unspecified: Secondary | ICD-10-CM | POA: Diagnosis not present

## 2022-12-27 DIAGNOSIS — M810 Age-related osteoporosis without current pathological fracture: Secondary | ICD-10-CM | POA: Diagnosis not present

## 2022-12-27 DIAGNOSIS — M47812 Spondylosis without myelopathy or radiculopathy, cervical region: Secondary | ICD-10-CM | POA: Diagnosis not present

## 2022-12-27 DIAGNOSIS — M1990 Unspecified osteoarthritis, unspecified site: Secondary | ICD-10-CM | POA: Diagnosis not present

## 2022-12-27 DIAGNOSIS — E1142 Type 2 diabetes mellitus with diabetic polyneuropathy: Secondary | ICD-10-CM | POA: Diagnosis not present

## 2022-12-27 DIAGNOSIS — I10 Essential (primary) hypertension: Secondary | ICD-10-CM | POA: Diagnosis not present

## 2023-01-03 DIAGNOSIS — F32A Depression, unspecified: Secondary | ICD-10-CM | POA: Diagnosis not present

## 2023-01-03 DIAGNOSIS — M069 Rheumatoid arthritis, unspecified: Secondary | ICD-10-CM | POA: Diagnosis not present

## 2023-01-03 DIAGNOSIS — E538 Deficiency of other specified B group vitamins: Secondary | ICD-10-CM | POA: Diagnosis not present

## 2023-01-03 DIAGNOSIS — E7849 Other hyperlipidemia: Secondary | ICD-10-CM | POA: Diagnosis not present

## 2023-01-03 DIAGNOSIS — E1142 Type 2 diabetes mellitus with diabetic polyneuropathy: Secondary | ICD-10-CM | POA: Diagnosis not present

## 2023-01-03 DIAGNOSIS — M810 Age-related osteoporosis without current pathological fracture: Secondary | ICD-10-CM | POA: Diagnosis not present

## 2023-01-03 DIAGNOSIS — M1990 Unspecified osteoarthritis, unspecified site: Secondary | ICD-10-CM | POA: Diagnosis not present

## 2023-01-03 DIAGNOSIS — I10 Essential (primary) hypertension: Secondary | ICD-10-CM | POA: Diagnosis not present

## 2023-01-03 DIAGNOSIS — M47812 Spondylosis without myelopathy or radiculopathy, cervical region: Secondary | ICD-10-CM | POA: Diagnosis not present

## 2023-01-09 DIAGNOSIS — E119 Type 2 diabetes mellitus without complications: Secondary | ICD-10-CM | POA: Diagnosis not present

## 2023-01-09 DIAGNOSIS — H524 Presbyopia: Secondary | ICD-10-CM | POA: Diagnosis not present

## 2023-01-15 DIAGNOSIS — M810 Age-related osteoporosis without current pathological fracture: Secondary | ICD-10-CM | POA: Diagnosis not present

## 2023-01-15 DIAGNOSIS — M1990 Unspecified osteoarthritis, unspecified site: Secondary | ICD-10-CM | POA: Diagnosis not present

## 2023-01-15 DIAGNOSIS — E538 Deficiency of other specified B group vitamins: Secondary | ICD-10-CM | POA: Diagnosis not present

## 2023-01-15 DIAGNOSIS — E1142 Type 2 diabetes mellitus with diabetic polyneuropathy: Secondary | ICD-10-CM | POA: Diagnosis not present

## 2023-01-15 DIAGNOSIS — F32A Depression, unspecified: Secondary | ICD-10-CM | POA: Diagnosis not present

## 2023-01-15 DIAGNOSIS — M47812 Spondylosis without myelopathy or radiculopathy, cervical region: Secondary | ICD-10-CM | POA: Diagnosis not present

## 2023-01-15 DIAGNOSIS — E7849 Other hyperlipidemia: Secondary | ICD-10-CM | POA: Diagnosis not present

## 2023-01-15 DIAGNOSIS — M069 Rheumatoid arthritis, unspecified: Secondary | ICD-10-CM | POA: Diagnosis not present

## 2023-01-15 DIAGNOSIS — I10 Essential (primary) hypertension: Secondary | ICD-10-CM | POA: Diagnosis not present

## 2023-01-29 DIAGNOSIS — R202 Paresthesia of skin: Secondary | ICD-10-CM | POA: Diagnosis not present

## 2023-01-29 DIAGNOSIS — G5793 Unspecified mononeuropathy of bilateral lower limbs: Secondary | ICD-10-CM | POA: Diagnosis not present

## 2023-01-29 DIAGNOSIS — R200 Anesthesia of skin: Secondary | ICD-10-CM | POA: Diagnosis not present

## 2023-02-18 DIAGNOSIS — I10 Essential (primary) hypertension: Secondary | ICD-10-CM | POA: Diagnosis not present

## 2023-02-18 DIAGNOSIS — Z6841 Body Mass Index (BMI) 40.0 and over, adult: Secondary | ICD-10-CM | POA: Diagnosis not present

## 2023-02-18 DIAGNOSIS — N179 Acute kidney failure, unspecified: Secondary | ICD-10-CM | POA: Diagnosis not present

## 2023-02-18 DIAGNOSIS — R531 Weakness: Secondary | ICD-10-CM | POA: Diagnosis not present

## 2023-02-18 DIAGNOSIS — R300 Dysuria: Secondary | ICD-10-CM | POA: Diagnosis not present

## 2023-02-18 DIAGNOSIS — R82998 Other abnormal findings in urine: Secondary | ICD-10-CM | POA: Diagnosis not present

## 2023-02-18 DIAGNOSIS — B961 Klebsiella pneumoniae [K. pneumoniae] as the cause of diseases classified elsewhere: Secondary | ICD-10-CM | POA: Diagnosis not present

## 2023-02-18 DIAGNOSIS — E785 Hyperlipidemia, unspecified: Secondary | ICD-10-CM | POA: Diagnosis not present

## 2023-02-18 DIAGNOSIS — R6520 Severe sepsis without septic shock: Secondary | ICD-10-CM | POA: Diagnosis not present

## 2023-02-18 DIAGNOSIS — R509 Fever, unspecified: Secondary | ICD-10-CM | POA: Diagnosis not present

## 2023-02-18 DIAGNOSIS — A419 Sepsis, unspecified organism: Secondary | ICD-10-CM | POA: Diagnosis not present

## 2023-02-18 DIAGNOSIS — R944 Abnormal results of kidney function studies: Secondary | ICD-10-CM | POA: Diagnosis not present

## 2023-02-18 DIAGNOSIS — G9340 Encephalopathy, unspecified: Secondary | ICD-10-CM | POA: Diagnosis not present

## 2023-02-18 DIAGNOSIS — R0989 Other specified symptoms and signs involving the circulatory and respiratory systems: Secondary | ICD-10-CM | POA: Diagnosis not present

## 2023-02-18 DIAGNOSIS — J9811 Atelectasis: Secondary | ICD-10-CM | POA: Diagnosis not present

## 2023-02-18 DIAGNOSIS — D72829 Elevated white blood cell count, unspecified: Secondary | ICD-10-CM | POA: Diagnosis not present

## 2023-02-18 DIAGNOSIS — R410 Disorientation, unspecified: Secondary | ICD-10-CM | POA: Diagnosis not present

## 2023-02-18 DIAGNOSIS — A4159 Other Gram-negative sepsis: Secondary | ICD-10-CM | POA: Diagnosis not present

## 2023-02-18 DIAGNOSIS — E119 Type 2 diabetes mellitus without complications: Secondary | ICD-10-CM | POA: Diagnosis not present

## 2023-02-18 DIAGNOSIS — N390 Urinary tract infection, site not specified: Secondary | ICD-10-CM | POA: Diagnosis not present

## 2023-02-18 DIAGNOSIS — E8721 Acute metabolic acidosis: Secondary | ICD-10-CM | POA: Diagnosis not present

## 2023-02-18 DIAGNOSIS — E1140 Type 2 diabetes mellitus with diabetic neuropathy, unspecified: Secondary | ICD-10-CM | POA: Diagnosis not present

## 2023-03-01 DIAGNOSIS — N3020 Other chronic cystitis without hematuria: Secondary | ICD-10-CM | POA: Diagnosis not present

## 2023-03-01 DIAGNOSIS — E785 Hyperlipidemia, unspecified: Secondary | ICD-10-CM | POA: Diagnosis not present

## 2023-03-01 DIAGNOSIS — Z794 Long term (current) use of insulin: Secondary | ICD-10-CM | POA: Diagnosis not present

## 2023-03-01 DIAGNOSIS — Z7984 Long term (current) use of oral hypoglycemic drugs: Secondary | ICD-10-CM | POA: Diagnosis not present

## 2023-03-01 DIAGNOSIS — R296 Repeated falls: Secondary | ICD-10-CM | POA: Diagnosis not present

## 2023-03-01 DIAGNOSIS — Z7982 Long term (current) use of aspirin: Secondary | ICD-10-CM | POA: Diagnosis not present

## 2023-03-01 DIAGNOSIS — A4159 Other Gram-negative sepsis: Secondary | ICD-10-CM | POA: Diagnosis not present

## 2023-03-01 DIAGNOSIS — E1142 Type 2 diabetes mellitus with diabetic polyneuropathy: Secondary | ICD-10-CM | POA: Diagnosis not present

## 2023-03-01 DIAGNOSIS — I10 Essential (primary) hypertension: Secondary | ICD-10-CM | POA: Diagnosis not present

## 2023-03-04 DIAGNOSIS — N3020 Other chronic cystitis without hematuria: Secondary | ICD-10-CM | POA: Diagnosis not present

## 2023-03-04 DIAGNOSIS — Z794 Long term (current) use of insulin: Secondary | ICD-10-CM | POA: Diagnosis not present

## 2023-03-04 DIAGNOSIS — I10 Essential (primary) hypertension: Secondary | ICD-10-CM | POA: Diagnosis not present

## 2023-03-04 DIAGNOSIS — E785 Hyperlipidemia, unspecified: Secondary | ICD-10-CM | POA: Diagnosis not present

## 2023-03-04 DIAGNOSIS — R296 Repeated falls: Secondary | ICD-10-CM | POA: Diagnosis not present

## 2023-03-04 DIAGNOSIS — A4159 Other Gram-negative sepsis: Secondary | ICD-10-CM | POA: Diagnosis not present

## 2023-03-04 DIAGNOSIS — Z7982 Long term (current) use of aspirin: Secondary | ICD-10-CM | POA: Diagnosis not present

## 2023-03-04 DIAGNOSIS — E1142 Type 2 diabetes mellitus with diabetic polyneuropathy: Secondary | ICD-10-CM | POA: Diagnosis not present

## 2023-03-04 DIAGNOSIS — Z7984 Long term (current) use of oral hypoglycemic drugs: Secondary | ICD-10-CM | POA: Diagnosis not present

## 2023-03-05 DIAGNOSIS — N3020 Other chronic cystitis without hematuria: Secondary | ICD-10-CM | POA: Diagnosis not present

## 2023-03-05 DIAGNOSIS — Z7984 Long term (current) use of oral hypoglycemic drugs: Secondary | ICD-10-CM | POA: Diagnosis not present

## 2023-03-05 DIAGNOSIS — A4159 Other Gram-negative sepsis: Secondary | ICD-10-CM | POA: Diagnosis not present

## 2023-03-05 DIAGNOSIS — E785 Hyperlipidemia, unspecified: Secondary | ICD-10-CM | POA: Diagnosis not present

## 2023-03-05 DIAGNOSIS — Z7982 Long term (current) use of aspirin: Secondary | ICD-10-CM | POA: Diagnosis not present

## 2023-03-05 DIAGNOSIS — E1142 Type 2 diabetes mellitus with diabetic polyneuropathy: Secondary | ICD-10-CM | POA: Diagnosis not present

## 2023-03-05 DIAGNOSIS — Z794 Long term (current) use of insulin: Secondary | ICD-10-CM | POA: Diagnosis not present

## 2023-03-05 DIAGNOSIS — I10 Essential (primary) hypertension: Secondary | ICD-10-CM | POA: Diagnosis not present

## 2023-03-05 DIAGNOSIS — R296 Repeated falls: Secondary | ICD-10-CM | POA: Diagnosis not present

## 2023-03-07 DIAGNOSIS — A4159 Other Gram-negative sepsis: Secondary | ICD-10-CM | POA: Diagnosis not present

## 2023-03-07 DIAGNOSIS — E785 Hyperlipidemia, unspecified: Secondary | ICD-10-CM | POA: Diagnosis not present

## 2023-03-07 DIAGNOSIS — Z794 Long term (current) use of insulin: Secondary | ICD-10-CM | POA: Diagnosis not present

## 2023-03-07 DIAGNOSIS — R296 Repeated falls: Secondary | ICD-10-CM | POA: Diagnosis not present

## 2023-03-07 DIAGNOSIS — Z7984 Long term (current) use of oral hypoglycemic drugs: Secondary | ICD-10-CM | POA: Diagnosis not present

## 2023-03-07 DIAGNOSIS — Z7982 Long term (current) use of aspirin: Secondary | ICD-10-CM | POA: Diagnosis not present

## 2023-03-07 DIAGNOSIS — N3020 Other chronic cystitis without hematuria: Secondary | ICD-10-CM | POA: Diagnosis not present

## 2023-03-07 DIAGNOSIS — E1142 Type 2 diabetes mellitus with diabetic polyneuropathy: Secondary | ICD-10-CM | POA: Diagnosis not present

## 2023-03-07 DIAGNOSIS — I10 Essential (primary) hypertension: Secondary | ICD-10-CM | POA: Diagnosis not present

## 2023-03-10 DIAGNOSIS — N3020 Other chronic cystitis without hematuria: Secondary | ICD-10-CM | POA: Diagnosis not present

## 2023-03-10 DIAGNOSIS — Z7982 Long term (current) use of aspirin: Secondary | ICD-10-CM | POA: Diagnosis not present

## 2023-03-10 DIAGNOSIS — E1142 Type 2 diabetes mellitus with diabetic polyneuropathy: Secondary | ICD-10-CM | POA: Diagnosis not present

## 2023-03-10 DIAGNOSIS — R296 Repeated falls: Secondary | ICD-10-CM | POA: Diagnosis not present

## 2023-03-10 DIAGNOSIS — Z7984 Long term (current) use of oral hypoglycemic drugs: Secondary | ICD-10-CM | POA: Diagnosis not present

## 2023-03-10 DIAGNOSIS — I10 Essential (primary) hypertension: Secondary | ICD-10-CM | POA: Diagnosis not present

## 2023-03-10 DIAGNOSIS — E785 Hyperlipidemia, unspecified: Secondary | ICD-10-CM | POA: Diagnosis not present

## 2023-03-10 DIAGNOSIS — Z794 Long term (current) use of insulin: Secondary | ICD-10-CM | POA: Diagnosis not present

## 2023-03-10 DIAGNOSIS — A4159 Other Gram-negative sepsis: Secondary | ICD-10-CM | POA: Diagnosis not present

## 2023-03-11 DIAGNOSIS — N3020 Other chronic cystitis without hematuria: Secondary | ICD-10-CM | POA: Diagnosis not present

## 2023-03-11 DIAGNOSIS — E785 Hyperlipidemia, unspecified: Secondary | ICD-10-CM | POA: Diagnosis not present

## 2023-03-11 DIAGNOSIS — Z7984 Long term (current) use of oral hypoglycemic drugs: Secondary | ICD-10-CM | POA: Diagnosis not present

## 2023-03-11 DIAGNOSIS — R296 Repeated falls: Secondary | ICD-10-CM | POA: Diagnosis not present

## 2023-03-11 DIAGNOSIS — A4159 Other Gram-negative sepsis: Secondary | ICD-10-CM | POA: Diagnosis not present

## 2023-03-11 DIAGNOSIS — Z794 Long term (current) use of insulin: Secondary | ICD-10-CM | POA: Diagnosis not present

## 2023-03-11 DIAGNOSIS — E1142 Type 2 diabetes mellitus with diabetic polyneuropathy: Secondary | ICD-10-CM | POA: Diagnosis not present

## 2023-03-11 DIAGNOSIS — Z7982 Long term (current) use of aspirin: Secondary | ICD-10-CM | POA: Diagnosis not present

## 2023-03-11 DIAGNOSIS — I10 Essential (primary) hypertension: Secondary | ICD-10-CM | POA: Diagnosis not present

## 2023-03-13 DIAGNOSIS — E1140 Type 2 diabetes mellitus with diabetic neuropathy, unspecified: Secondary | ICD-10-CM | POA: Diagnosis not present

## 2023-03-13 DIAGNOSIS — E781 Pure hyperglyceridemia: Secondary | ICD-10-CM | POA: Diagnosis not present

## 2023-03-14 DIAGNOSIS — I10 Essential (primary) hypertension: Secondary | ICD-10-CM | POA: Diagnosis not present

## 2023-03-14 DIAGNOSIS — Z794 Long term (current) use of insulin: Secondary | ICD-10-CM | POA: Diagnosis not present

## 2023-03-14 DIAGNOSIS — E1142 Type 2 diabetes mellitus with diabetic polyneuropathy: Secondary | ICD-10-CM | POA: Diagnosis not present

## 2023-03-14 DIAGNOSIS — A4159 Other Gram-negative sepsis: Secondary | ICD-10-CM | POA: Diagnosis not present

## 2023-03-14 DIAGNOSIS — E785 Hyperlipidemia, unspecified: Secondary | ICD-10-CM | POA: Diagnosis not present

## 2023-03-14 DIAGNOSIS — R296 Repeated falls: Secondary | ICD-10-CM | POA: Diagnosis not present

## 2023-03-14 DIAGNOSIS — Z7984 Long term (current) use of oral hypoglycemic drugs: Secondary | ICD-10-CM | POA: Diagnosis not present

## 2023-03-14 DIAGNOSIS — N3020 Other chronic cystitis without hematuria: Secondary | ICD-10-CM | POA: Diagnosis not present

## 2023-03-14 DIAGNOSIS — Z7982 Long term (current) use of aspirin: Secondary | ICD-10-CM | POA: Diagnosis not present

## 2023-03-18 DIAGNOSIS — Z794 Long term (current) use of insulin: Secondary | ICD-10-CM | POA: Diagnosis not present

## 2023-03-18 DIAGNOSIS — Z7982 Long term (current) use of aspirin: Secondary | ICD-10-CM | POA: Diagnosis not present

## 2023-03-18 DIAGNOSIS — Z7984 Long term (current) use of oral hypoglycemic drugs: Secondary | ICD-10-CM | POA: Diagnosis not present

## 2023-03-18 DIAGNOSIS — N3020 Other chronic cystitis without hematuria: Secondary | ICD-10-CM | POA: Diagnosis not present

## 2023-03-18 DIAGNOSIS — A4159 Other Gram-negative sepsis: Secondary | ICD-10-CM | POA: Diagnosis not present

## 2023-03-18 DIAGNOSIS — E1142 Type 2 diabetes mellitus with diabetic polyneuropathy: Secondary | ICD-10-CM | POA: Diagnosis not present

## 2023-03-18 DIAGNOSIS — R296 Repeated falls: Secondary | ICD-10-CM | POA: Diagnosis not present

## 2023-03-18 DIAGNOSIS — E785 Hyperlipidemia, unspecified: Secondary | ICD-10-CM | POA: Diagnosis not present

## 2023-03-18 DIAGNOSIS — I10 Essential (primary) hypertension: Secondary | ICD-10-CM | POA: Diagnosis not present

## 2023-03-19 DIAGNOSIS — Z7984 Long term (current) use of oral hypoglycemic drugs: Secondary | ICD-10-CM | POA: Diagnosis not present

## 2023-03-19 DIAGNOSIS — E1142 Type 2 diabetes mellitus with diabetic polyneuropathy: Secondary | ICD-10-CM | POA: Diagnosis not present

## 2023-03-19 DIAGNOSIS — A4159 Other Gram-negative sepsis: Secondary | ICD-10-CM | POA: Diagnosis not present

## 2023-03-19 DIAGNOSIS — Z7982 Long term (current) use of aspirin: Secondary | ICD-10-CM | POA: Diagnosis not present

## 2023-03-19 DIAGNOSIS — E785 Hyperlipidemia, unspecified: Secondary | ICD-10-CM | POA: Diagnosis not present

## 2023-03-19 DIAGNOSIS — I10 Essential (primary) hypertension: Secondary | ICD-10-CM | POA: Diagnosis not present

## 2023-03-19 DIAGNOSIS — N3020 Other chronic cystitis without hematuria: Secondary | ICD-10-CM | POA: Diagnosis not present

## 2023-03-19 DIAGNOSIS — R296 Repeated falls: Secondary | ICD-10-CM | POA: Diagnosis not present

## 2023-03-19 DIAGNOSIS — Z794 Long term (current) use of insulin: Secondary | ICD-10-CM | POA: Diagnosis not present

## 2023-03-20 DIAGNOSIS — E1122 Type 2 diabetes mellitus with diabetic chronic kidney disease: Secondary | ICD-10-CM | POA: Diagnosis not present

## 2023-03-20 DIAGNOSIS — N3090 Cystitis, unspecified without hematuria: Secondary | ICD-10-CM | POA: Diagnosis not present

## 2023-03-20 DIAGNOSIS — N1832 Chronic kidney disease, stage 3b: Secondary | ICD-10-CM | POA: Diagnosis not present

## 2023-03-20 DIAGNOSIS — E1140 Type 2 diabetes mellitus with diabetic neuropathy, unspecified: Secondary | ICD-10-CM | POA: Diagnosis not present

## 2023-03-20 DIAGNOSIS — N390 Urinary tract infection, site not specified: Secondary | ICD-10-CM | POA: Diagnosis not present

## 2023-03-20 DIAGNOSIS — Z6841 Body Mass Index (BMI) 40.0 and over, adult: Secondary | ICD-10-CM | POA: Diagnosis not present

## 2023-03-21 DIAGNOSIS — I10 Essential (primary) hypertension: Secondary | ICD-10-CM | POA: Diagnosis not present

## 2023-03-21 DIAGNOSIS — E785 Hyperlipidemia, unspecified: Secondary | ICD-10-CM | POA: Diagnosis not present

## 2023-03-21 DIAGNOSIS — Z7984 Long term (current) use of oral hypoglycemic drugs: Secondary | ICD-10-CM | POA: Diagnosis not present

## 2023-03-21 DIAGNOSIS — Z794 Long term (current) use of insulin: Secondary | ICD-10-CM | POA: Diagnosis not present

## 2023-03-21 DIAGNOSIS — N3020 Other chronic cystitis without hematuria: Secondary | ICD-10-CM | POA: Diagnosis not present

## 2023-03-21 DIAGNOSIS — A4159 Other Gram-negative sepsis: Secondary | ICD-10-CM | POA: Diagnosis not present

## 2023-03-21 DIAGNOSIS — E1142 Type 2 diabetes mellitus with diabetic polyneuropathy: Secondary | ICD-10-CM | POA: Diagnosis not present

## 2023-03-21 DIAGNOSIS — Z7982 Long term (current) use of aspirin: Secondary | ICD-10-CM | POA: Diagnosis not present

## 2023-03-21 DIAGNOSIS — R296 Repeated falls: Secondary | ICD-10-CM | POA: Diagnosis not present

## 2023-03-24 DIAGNOSIS — E1142 Type 2 diabetes mellitus with diabetic polyneuropathy: Secondary | ICD-10-CM | POA: Diagnosis not present

## 2023-03-24 DIAGNOSIS — R296 Repeated falls: Secondary | ICD-10-CM | POA: Diagnosis not present

## 2023-03-24 DIAGNOSIS — Z794 Long term (current) use of insulin: Secondary | ICD-10-CM | POA: Diagnosis not present

## 2023-03-24 DIAGNOSIS — Z7984 Long term (current) use of oral hypoglycemic drugs: Secondary | ICD-10-CM | POA: Diagnosis not present

## 2023-03-24 DIAGNOSIS — E785 Hyperlipidemia, unspecified: Secondary | ICD-10-CM | POA: Diagnosis not present

## 2023-03-24 DIAGNOSIS — N3020 Other chronic cystitis without hematuria: Secondary | ICD-10-CM | POA: Diagnosis not present

## 2023-03-24 DIAGNOSIS — A4159 Other Gram-negative sepsis: Secondary | ICD-10-CM | POA: Diagnosis not present

## 2023-03-24 DIAGNOSIS — Z7982 Long term (current) use of aspirin: Secondary | ICD-10-CM | POA: Diagnosis not present

## 2023-03-24 DIAGNOSIS — I10 Essential (primary) hypertension: Secondary | ICD-10-CM | POA: Diagnosis not present

## 2023-03-27 DIAGNOSIS — Z7984 Long term (current) use of oral hypoglycemic drugs: Secondary | ICD-10-CM | POA: Diagnosis not present

## 2023-03-27 DIAGNOSIS — N3020 Other chronic cystitis without hematuria: Secondary | ICD-10-CM | POA: Diagnosis not present

## 2023-03-27 DIAGNOSIS — R296 Repeated falls: Secondary | ICD-10-CM | POA: Diagnosis not present

## 2023-03-27 DIAGNOSIS — E785 Hyperlipidemia, unspecified: Secondary | ICD-10-CM | POA: Diagnosis not present

## 2023-03-27 DIAGNOSIS — A4159 Other Gram-negative sepsis: Secondary | ICD-10-CM | POA: Diagnosis not present

## 2023-03-27 DIAGNOSIS — Z794 Long term (current) use of insulin: Secondary | ICD-10-CM | POA: Diagnosis not present

## 2023-03-27 DIAGNOSIS — I10 Essential (primary) hypertension: Secondary | ICD-10-CM | POA: Diagnosis not present

## 2023-03-27 DIAGNOSIS — Z7982 Long term (current) use of aspirin: Secondary | ICD-10-CM | POA: Diagnosis not present

## 2023-03-27 DIAGNOSIS — E1142 Type 2 diabetes mellitus with diabetic polyneuropathy: Secondary | ICD-10-CM | POA: Diagnosis not present

## 2023-03-28 DIAGNOSIS — R296 Repeated falls: Secondary | ICD-10-CM | POA: Diagnosis not present

## 2023-03-28 DIAGNOSIS — Z7984 Long term (current) use of oral hypoglycemic drugs: Secondary | ICD-10-CM | POA: Diagnosis not present

## 2023-03-28 DIAGNOSIS — I10 Essential (primary) hypertension: Secondary | ICD-10-CM | POA: Diagnosis not present

## 2023-03-28 DIAGNOSIS — E785 Hyperlipidemia, unspecified: Secondary | ICD-10-CM | POA: Diagnosis not present

## 2023-03-28 DIAGNOSIS — Z7982 Long term (current) use of aspirin: Secondary | ICD-10-CM | POA: Diagnosis not present

## 2023-03-28 DIAGNOSIS — N3020 Other chronic cystitis without hematuria: Secondary | ICD-10-CM | POA: Diagnosis not present

## 2023-03-28 DIAGNOSIS — E1142 Type 2 diabetes mellitus with diabetic polyneuropathy: Secondary | ICD-10-CM | POA: Diagnosis not present

## 2023-03-28 DIAGNOSIS — Z794 Long term (current) use of insulin: Secondary | ICD-10-CM | POA: Diagnosis not present

## 2023-03-28 DIAGNOSIS — A4159 Other Gram-negative sepsis: Secondary | ICD-10-CM | POA: Diagnosis not present

## 2023-04-03 DIAGNOSIS — Z794 Long term (current) use of insulin: Secondary | ICD-10-CM | POA: Diagnosis not present

## 2023-04-03 DIAGNOSIS — Z7982 Long term (current) use of aspirin: Secondary | ICD-10-CM | POA: Diagnosis not present

## 2023-04-03 DIAGNOSIS — Z7984 Long term (current) use of oral hypoglycemic drugs: Secondary | ICD-10-CM | POA: Diagnosis not present

## 2023-04-03 DIAGNOSIS — E785 Hyperlipidemia, unspecified: Secondary | ICD-10-CM | POA: Diagnosis not present

## 2023-04-03 DIAGNOSIS — A4159 Other Gram-negative sepsis: Secondary | ICD-10-CM | POA: Diagnosis not present

## 2023-04-03 DIAGNOSIS — I10 Essential (primary) hypertension: Secondary | ICD-10-CM | POA: Diagnosis not present

## 2023-04-03 DIAGNOSIS — E1142 Type 2 diabetes mellitus with diabetic polyneuropathy: Secondary | ICD-10-CM | POA: Diagnosis not present

## 2023-04-03 DIAGNOSIS — N3020 Other chronic cystitis without hematuria: Secondary | ICD-10-CM | POA: Diagnosis not present

## 2023-04-03 DIAGNOSIS — R296 Repeated falls: Secondary | ICD-10-CM | POA: Diagnosis not present

## 2023-04-09 DIAGNOSIS — Z7982 Long term (current) use of aspirin: Secondary | ICD-10-CM | POA: Diagnosis not present

## 2023-04-09 DIAGNOSIS — Z7984 Long term (current) use of oral hypoglycemic drugs: Secondary | ICD-10-CM | POA: Diagnosis not present

## 2023-04-09 DIAGNOSIS — E1142 Type 2 diabetes mellitus with diabetic polyneuropathy: Secondary | ICD-10-CM | POA: Diagnosis not present

## 2023-04-09 DIAGNOSIS — A4159 Other Gram-negative sepsis: Secondary | ICD-10-CM | POA: Diagnosis not present

## 2023-04-09 DIAGNOSIS — Z794 Long term (current) use of insulin: Secondary | ICD-10-CM | POA: Diagnosis not present

## 2023-04-09 DIAGNOSIS — R296 Repeated falls: Secondary | ICD-10-CM | POA: Diagnosis not present

## 2023-04-09 DIAGNOSIS — N3020 Other chronic cystitis without hematuria: Secondary | ICD-10-CM | POA: Diagnosis not present

## 2023-04-09 DIAGNOSIS — I10 Essential (primary) hypertension: Secondary | ICD-10-CM | POA: Diagnosis not present

## 2023-04-09 DIAGNOSIS — E785 Hyperlipidemia, unspecified: Secondary | ICD-10-CM | POA: Diagnosis not present

## 2023-04-15 DIAGNOSIS — E1142 Type 2 diabetes mellitus with diabetic polyneuropathy: Secondary | ICD-10-CM | POA: Diagnosis not present

## 2023-04-15 DIAGNOSIS — Z7984 Long term (current) use of oral hypoglycemic drugs: Secondary | ICD-10-CM | POA: Diagnosis not present

## 2023-04-15 DIAGNOSIS — I10 Essential (primary) hypertension: Secondary | ICD-10-CM | POA: Diagnosis not present

## 2023-04-15 DIAGNOSIS — A4159 Other Gram-negative sepsis: Secondary | ICD-10-CM | POA: Diagnosis not present

## 2023-04-15 DIAGNOSIS — R296 Repeated falls: Secondary | ICD-10-CM | POA: Diagnosis not present

## 2023-04-15 DIAGNOSIS — Z7982 Long term (current) use of aspirin: Secondary | ICD-10-CM | POA: Diagnosis not present

## 2023-04-15 DIAGNOSIS — N3020 Other chronic cystitis without hematuria: Secondary | ICD-10-CM | POA: Diagnosis not present

## 2023-04-15 DIAGNOSIS — Z794 Long term (current) use of insulin: Secondary | ICD-10-CM | POA: Diagnosis not present

## 2023-04-15 DIAGNOSIS — E785 Hyperlipidemia, unspecified: Secondary | ICD-10-CM | POA: Diagnosis not present

## 2023-04-21 DIAGNOSIS — R300 Dysuria: Secondary | ICD-10-CM | POA: Diagnosis not present

## 2023-04-25 ENCOUNTER — Telehealth: Payer: Self-pay

## 2023-04-25 NOTE — Telephone Encounter (Signed)
 Called Dr Delia Chimes office and spoke with Marletta Lor, she stated that when patient called their office on 04/23/23 for urine culture they did not have that result in yet, and advised patient at that time that they would call her back once results were finalized. Patient misinterpreted their feedback. Rhonna stated that she will send a message to the provider letting them know culture is back and to call patient. I called patient and advised her of this information

## 2023-04-25 NOTE — Telephone Encounter (Signed)
 Patient called stating that she did a UA and Urine culture with her provider at [facility] Dr Gretta Cool and urine culture is in care everywhere, she did not see the provider just did a UA drop off. They told her that they would send [place] the results and to contact us to treat this, they did not explain why they could not treat this and patient states she ended up at the hospital before for UTI due to sepsis. Patient sees Dr Sherron Monday and since he is not in the office until Monday patient asked for someone else could take a look and help her if possible. Please review.

## 2023-04-28 NOTE — Telephone Encounter (Signed)
 Pt called triage line stating that she's been waiting for [place] to call her back about treatment for her UTI. Pt states Dr.Hedrick's office is going to send [place] her UA/Culture results so we can treat her since we're urology.  Pt was informed that she will need an appt with one of our providers so she can be treated by [place]. Pt states she is going to wait and see if Dr.Hedrick will send something, if not, she will call tomorrow morning and schedule an appt with [place].

## 2023-04-28 NOTE — Telephone Encounter (Signed)
 Incoming call from pt on triage line who questions why we have not called in treatment for her UTI. Advised pt that urine culture will need to be treated by ordering provider (Dr. Webb Silversmith office). Patient states that she was told our office would be treating her, advised pt that per our office protocol she will need to be seen and evaluated by our providers for treatment. She declines appointment with our office. Advised pt to call Dr. Webb Silversmith office as I think she has misunderstood their instructions. Pt voiced understanding.

## 2023-04-29 DIAGNOSIS — G510 Bell's palsy: Secondary | ICD-10-CM | POA: Diagnosis not present

## 2023-04-29 DIAGNOSIS — R1031 Right lower quadrant pain: Secondary | ICD-10-CM | POA: Diagnosis not present

## 2023-04-29 DIAGNOSIS — M810 Age-related osteoporosis without current pathological fracture: Secondary | ICD-10-CM | POA: Diagnosis not present

## 2023-04-29 DIAGNOSIS — Z794 Long term (current) use of insulin: Secondary | ICD-10-CM | POA: Diagnosis not present

## 2023-04-29 DIAGNOSIS — I10 Essential (primary) hypertension: Secondary | ICD-10-CM | POA: Diagnosis not present

## 2023-04-29 DIAGNOSIS — Z7982 Long term (current) use of aspirin: Secondary | ICD-10-CM | POA: Diagnosis not present

## 2023-04-29 DIAGNOSIS — E785 Hyperlipidemia, unspecified: Secondary | ICD-10-CM | POA: Diagnosis not present

## 2023-04-29 DIAGNOSIS — E119 Type 2 diabetes mellitus without complications: Secondary | ICD-10-CM | POA: Diagnosis not present

## 2023-04-29 DIAGNOSIS — R350 Frequency of micturition: Secondary | ICD-10-CM | POA: Diagnosis not present

## 2023-04-29 DIAGNOSIS — R519 Headache, unspecified: Secondary | ICD-10-CM | POA: Diagnosis not present

## 2023-05-13 DIAGNOSIS — I10 Essential (primary) hypertension: Secondary | ICD-10-CM | POA: Diagnosis not present

## 2023-05-13 DIAGNOSIS — Z794 Long term (current) use of insulin: Secondary | ICD-10-CM | POA: Diagnosis not present

## 2023-05-13 DIAGNOSIS — E119 Type 2 diabetes mellitus without complications: Secondary | ICD-10-CM | POA: Diagnosis not present

## 2023-05-13 DIAGNOSIS — G510 Bell's palsy: Secondary | ICD-10-CM | POA: Diagnosis not present

## 2023-05-28 DIAGNOSIS — Z7984 Long term (current) use of oral hypoglycemic drugs: Secondary | ICD-10-CM | POA: Diagnosis not present

## 2023-05-28 DIAGNOSIS — E119 Type 2 diabetes mellitus without complications: Secondary | ICD-10-CM | POA: Diagnosis not present

## 2023-05-28 DIAGNOSIS — Z66 Do not resuscitate: Secondary | ICD-10-CM | POA: Diagnosis not present

## 2023-05-28 DIAGNOSIS — W19XXXA Unspecified fall, initial encounter: Secondary | ICD-10-CM | POA: Diagnosis not present

## 2023-05-28 DIAGNOSIS — E1140 Type 2 diabetes mellitus with diabetic neuropathy, unspecified: Secondary | ICD-10-CM | POA: Diagnosis not present

## 2023-05-28 DIAGNOSIS — R4182 Altered mental status, unspecified: Secondary | ICD-10-CM | POA: Diagnosis not present

## 2023-05-28 DIAGNOSIS — Z87891 Personal history of nicotine dependence: Secondary | ICD-10-CM | POA: Diagnosis not present

## 2023-05-28 DIAGNOSIS — Z79899 Other long term (current) drug therapy: Secondary | ICD-10-CM | POA: Diagnosis not present

## 2023-05-28 DIAGNOSIS — I10 Essential (primary) hypertension: Secondary | ICD-10-CM | POA: Diagnosis not present

## 2023-05-28 DIAGNOSIS — Z794 Long term (current) use of insulin: Secondary | ICD-10-CM | POA: Diagnosis not present

## 2023-05-28 DIAGNOSIS — R296 Repeated falls: Secondary | ICD-10-CM | POA: Diagnosis not present

## 2023-05-28 DIAGNOSIS — Z043 Encounter for examination and observation following other accident: Secondary | ICD-10-CM | POA: Diagnosis not present

## 2023-05-28 DIAGNOSIS — N3000 Acute cystitis without hematuria: Secondary | ICD-10-CM | POA: Diagnosis not present

## 2023-05-29 DIAGNOSIS — R296 Repeated falls: Secondary | ICD-10-CM | POA: Diagnosis not present

## 2023-05-29 DIAGNOSIS — Z043 Encounter for examination and observation following other accident: Secondary | ICD-10-CM | POA: Diagnosis not present

## 2023-05-30 DIAGNOSIS — N3000 Acute cystitis without hematuria: Secondary | ICD-10-CM | POA: Diagnosis not present

## 2023-06-02 DIAGNOSIS — G5793 Unspecified mononeuropathy of bilateral lower limbs: Secondary | ICD-10-CM | POA: Diagnosis not present

## 2023-06-02 DIAGNOSIS — R200 Anesthesia of skin: Secondary | ICD-10-CM | POA: Diagnosis not present

## 2023-06-02 DIAGNOSIS — N390 Urinary tract infection, site not specified: Secondary | ICD-10-CM | POA: Diagnosis not present

## 2023-06-02 DIAGNOSIS — N1831 Chronic kidney disease, stage 3a: Secondary | ICD-10-CM | POA: Diagnosis not present

## 2023-06-02 DIAGNOSIS — R202 Paresthesia of skin: Secondary | ICD-10-CM | POA: Diagnosis not present

## 2023-06-03 DIAGNOSIS — N390 Urinary tract infection, site not specified: Secondary | ICD-10-CM | POA: Diagnosis not present

## 2023-06-06 DIAGNOSIS — N390 Urinary tract infection, site not specified: Secondary | ICD-10-CM | POA: Diagnosis not present

## 2023-06-11 DIAGNOSIS — R300 Dysuria: Secondary | ICD-10-CM | POA: Diagnosis not present

## 2023-06-17 DIAGNOSIS — R300 Dysuria: Secondary | ICD-10-CM | POA: Diagnosis not present

## 2023-06-23 ENCOUNTER — Telehealth: Payer: Self-pay

## 2023-06-23 ENCOUNTER — Ambulatory Visit: Payer: Medicare HMO | Admitting: Urology

## 2023-06-23 DIAGNOSIS — R300 Dysuria: Secondary | ICD-10-CM | POA: Diagnosis not present

## 2023-06-23 DIAGNOSIS — N390 Urinary tract infection, site not specified: Secondary | ICD-10-CM | POA: Diagnosis not present

## 2023-06-23 NOTE — Telephone Encounter (Signed)
 PA sent for solifenacin , waiting on response.

## 2023-06-24 DIAGNOSIS — N390 Urinary tract infection, site not specified: Secondary | ICD-10-CM | POA: Diagnosis not present

## 2023-06-25 DIAGNOSIS — N390 Urinary tract infection, site not specified: Secondary | ICD-10-CM | POA: Diagnosis not present

## 2023-06-30 ENCOUNTER — Ambulatory Visit (INDEPENDENT_AMBULATORY_CARE_PROVIDER_SITE_OTHER): Payer: Self-pay | Admitting: Urology

## 2023-06-30 VITALS — BP 198/79 | HR 88 | Ht 60.0 in | Wt 222.0 lb

## 2023-06-30 DIAGNOSIS — N390 Urinary tract infection, site not specified: Secondary | ICD-10-CM

## 2023-06-30 DIAGNOSIS — N3020 Other chronic cystitis without hematuria: Secondary | ICD-10-CM

## 2023-06-30 DIAGNOSIS — Z87440 Personal history of urinary (tract) infections: Secondary | ICD-10-CM

## 2023-06-30 DIAGNOSIS — O2330 Infections of other parts of urinary tract in pregnancy, unspecified trimester: Secondary | ICD-10-CM

## 2023-06-30 DIAGNOSIS — N3281 Overactive bladder: Secondary | ICD-10-CM

## 2023-06-30 LAB — MICROSCOPIC EXAMINATION

## 2023-06-30 MED ORDER — OXYBUTYNIN CHLORIDE ER 10 MG PO TB24: 10.0000 mg | ORAL_TABLET | Freq: Every day | ORAL | 3 refills | Status: DC

## 2023-06-30 MED ORDER — SOLIFENACIN SUCCINATE 5 MG PO TABS: 5.0000 mg | ORAL_TABLET | Freq: Every day | ORAL | 3 refills | Status: DC

## 2023-06-30 MED ORDER — CEPHALEXIN 250 MG PO CAPS: 250.0000 mg | ORAL_CAPSULE | Freq: Every day | ORAL | 11 refills | Status: AC
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
## 2023-07-01 LAB — URINALYSIS, COMPLETE
Bilirubin, UA: NEGATIVE
Glucose, UA: NEGATIVE
Ketones, UA: NEGATIVE
Leukocytes,UA: NEGATIVE
Nitrite, UA: NEGATIVE
RBC, UA: NEGATIVE
Specific Gravity, UA: 1.03 (ref 1.005–1.030)
Urobilinogen, Ur: 0.2 mg/dL (ref 0.2–1.0)
pH, UA: 6 (ref 5.0–7.5)

## 2023-07-01 LAB — MICROSCOPIC EXAMINATION

## 2023-07-02 LAB — CULTURE, URINE COMPREHENSIVE

## 2023-07-04 ENCOUNTER — Ambulatory Visit
Admission: RE | Admit: 2023-07-04 | Discharge: 2023-07-04 | Disposition: A | Discharge status: Home / Self Care | Source: Ambulatory Visit | Attending: Urology | Admitting: Urology

## 2023-07-04 DIAGNOSIS — Z87440 Personal history of urinary (tract) infections: Secondary | ICD-10-CM | POA: Insufficient documentation

## 2023-07-04 DIAGNOSIS — O2330 Infections of other parts of urinary tract in pregnancy, unspecified trimester: Secondary | ICD-10-CM

## 2023-07-04 DIAGNOSIS — N390 Urinary tract infection, site not specified: Secondary | ICD-10-CM | POA: Insufficient documentation

## 2023-08-25 ENCOUNTER — Ambulatory Visit: Admitting: Urology

## 2023-08-28 ENCOUNTER — Ambulatory Visit (INDEPENDENT_AMBULATORY_CARE_PROVIDER_SITE_OTHER): Admitting: Urology

## 2023-08-28 ENCOUNTER — Encounter: Payer: Self-pay | Admitting: Urology

## 2023-08-28 VITALS — Ht 60.0 in | Wt 220.0 lb

## 2023-08-28 DIAGNOSIS — N952 Postmenopausal atrophic vaginitis: Secondary | ICD-10-CM | POA: Diagnosis not present

## 2023-08-28 DIAGNOSIS — N3946 Mixed incontinence: Secondary | ICD-10-CM | POA: Diagnosis not present

## 2023-08-28 DIAGNOSIS — N390 Urinary tract infection, site not specified: Secondary | ICD-10-CM

## 2023-08-28 LAB — URINALYSIS, COMPLETE
Bilirubin, UA: NEGATIVE
Glucose, UA: NEGATIVE
Ketones, UA: NEGATIVE
Nitrite, UA: NEGATIVE
RBC, UA: NEGATIVE
Specific Gravity, UA: 1.025 (ref 1.005–1.030)
Urobilinogen, Ur: 0.2 mg/dL (ref 0.2–1.0)
pH, UA: 6 (ref 5.0–7.5)

## 2023-08-28 LAB — MICROSCOPIC EXAMINATION: WBC, UA: >30 /HPF — AB (ref 0–5)

## 2023-08-28 LAB — BLADDER SCAN AMB NON-IMAGING

## 2023-08-28 MED ORDER — METHENAMINE HIPPURATE 1 G PO TABS: 1.0000 g | ORAL_TABLET | Freq: Two times a day (BID) | ORAL | 3 refills | Status: DC

## 2023-08-28 NOTE — Progress Notes (Signed)
 In and Out Catheterization  Patient is present today for a I & O catheterization due to UTI. Patient was cleaned and prepped in a sterile fashion with betadine . A 14FR cath was inserted no complications were noted , 50ml of urine return was noted, urine was yellow in color. A clean urine sample was collected for UA. Bladder was drained  And catheter was removed with out difficulty.    Performed by: Laymon Debby LATHER  Follow up/ Additional notes: No follow up

## 2023-08-28 NOTE — Progress Notes (Signed)
 08/28/2023 12:53 PM   Anna Ramos 06-24-1940 [id number]  Referring provider: Valora Agent, MD [address] [place],  [place] [address]  Urological history: 1. rUTI's - RUS (06/2023)  - Jun 30, 2023, MUF -June 17, 2023, Klebsiella pneumoniae -June 11, 2023, greater than 2 organisms recovered -June 06, 2023, mixed urogenital flora -May 29, 2023, Klebsiella pneumonia -February 24th 2025, Klebsiella pneumonia -January 23rd 2025, E. coli -December 23, 2022 more than 3 organisms - Trimethoprim  100 mg daily, Keflex  250 mg daily and Macrobid  100 mg daily  2. Mixed incontinence - Myrbetriq  50 mg daily was cost prohibitive - Vesicare  5 mg daily and oxybutynin  XL 10 mg daily   3. 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 woman who presents today for pain with urination and malodorous urine with her niece, Desarae.   Previous records reviewed.   She has been seeing different providers for her symptoms and has been placed on 3 different prophylactic antibiotics.  She has been taking trimethoprim , Keflex  and Macrobid  daily in an effort to thwart her symptoms.    She has been having dysuria with cold chills.  She was not using the vaginal estrogen cream as it was too expensive.  Patient denies any modifying or aggravating factors.  Patient denies any recent UTI's, gross hematuria or suprapubic/flank pain.  Patient denies any fevers, chills, nausea or vomiting.    CATH UA is yellow clear, specific gravity 1.025, pH 6.0, 3+ protein, 1+ leukocyte, greater than 30 WBCs, 0-10 RBCs, 0-10 epithelial cells, mucus threads present many bacteria.  PVR 0 mL   Former smoker, recently quit.    Serum creatinine (05/2023) 1.21, eGFR 45  Hemoglobin A1c (02/2023) 6.5     PMH: Past Medical History:  Diagnosis Date   Arthritis    Benign breast lumps    Chicken pox    Depression    Diabetes mellitus without complication (HCC)    GERD (gastroesophageal reflux disease)    Hyperlipidemia    Hypertension    Osteoporosis, post-menopausal     Surgical History: Past Surgical History:  Procedure Laterality Date   benign breast lump removals     BREAST BIOPSY Bilateral 1984   benign cysts   COLONOSCOPY  07/27/2013   COLONOSCOPY WITH PROPOFOL  N/A 12/31/2018   Procedure: COLONOSCOPY WITH PROPOFOL ;  Surgeon: Toledo, Ladell POUR, MD;  Location: [facility] ENDOSCOPY;  Service: Gastroenterology;  Laterality: N/A;   ECTOPIC PREGNANCY SURGERY  1974   open reduction tibial shaft fracture  07/16/2016   replacement total knee with resurfacing     TOTAL KNEE ARTHROPLASTY Left 04/20/2018   TUBAL LIGATION      Home Medications:  Allergies as of 08/28/2023   No Known Allergies      Medication List        Accurate as of August 28, 2023 11:59 PM. If you have any questions, ask your nurse or doctor.          STOP taking these medications    BYETTA 10 MCG PEN [place] Stopped by: Sula Fetterly   valACYclovir 1000 MG tablet Commonly known as: VALTREX Stopped by: Katia Hannen       TAKE these medications    aspirin EC 81 MG tablet Take 81 mg by mouth daily. Swallow  whole.   CALCIUM CITRATE-VITAMIN D3 PO Take by mouth 2 (two) times daily.   cephALEXin  250 MG capsule Commonly known as: KEFLEX  Take 1 capsule (250 mg total) by mouth daily.   Cranberry 400 MG Caps Take by mouth daily.   DULoxetine 60 MG capsule Commonly known as: CYMBALTA Take 60 mg by mouth daily.   gabapentin 300 MG capsule Commonly known as: NEURONTIN Take 300 mg by mouth 3 (three) times daily.   hydrochlorothiazide 12.5 MG capsule Commonly known as: MICROZIDE Take 12.5 mg by mouth daily.   insulin NPH-regular Human (70-30) 100 UNIT/ML injection Inject 20 Units into the skin  2 (two) times daily before a meal.   lisinopril 40 MG tablet Commonly known as: ZESTRIL Take 40 mg by mouth daily.   metFORMIN 500 MG tablet Commonly known as: GLUCOPHAGE Take 500 mg by mouth 2 (two) times daily.   methenamine  1 g tablet Commonly known as: Hiprex  Take 1 tablet (1 g total) by mouth 2 (two) times daily with a meal. Started by: Anthone Prieur   Omega 3 1000 MG Caps Take by mouth.   omeprazole 20 MG capsule Commonly known as: PRILOSEC Take 20 mg by mouth daily.   OneTouch Ultra Test test strip Generic drug: glucose blood 2 (two) times daily.   oxybutynin  10 MG 24 hr tablet Commonly known as: DITROPAN -XL Take 1 tablet (10 mg total) by mouth daily.   pravastatin 10 MG tablet Commonly known as: PRAVACHOL Take 10 mg by mouth daily.   solifenacin  5 MG tablet Commonly known as: VESICARE  Take 1 tablet (5 mg total) by mouth daily.   VITAMIN D-3 PO Take 1,000 mg by mouth daily.        Allergies: No Known Allergies  Family History: Family History  Problem Relation Age of Onset   Breast cancer Sister 31   Hypertension Sister    Diabetes Sister    Liver cancer Mother    Osteoporosis Mother    Lupus Mother    Hypertension Father    Diabetes Father    Congenital heart disease Father    Colon polyps Brother    Diabetes Brother    Stroke Brother    Diabetes Maternal Grandmother    Prostate cancer Maternal Grandfather    Diabetes Paternal Grandmother    Heart attack Paternal Grandmother    Diabetes Paternal Grandfather     Social History: See HPI for pertinent social history  ROS: Pertinent ROS in HPI  Physical Exam: Ht 5' (1.524 m)   Wt 220 lb (99.8 kg)   BMI 42.97 kg/m   Constitutional:  Well nourished. Alert and oriented, No acute distress. HEENT: [place] AT, moist mucus membranes.  Trachea midline Cardiovascular: No clubbing, cyanosis, or edema. Respiratory: Normal respiratory effort, no increased work of breathing. Neurologic: Grossly  intact, no focal deficits, moving all 4 extremities. Psychiatric: Normal mood and affect.  Laboratory Data: See EPIC and HPI  I have reviewed the labs.   Pertinent Imaging:  08/28/23 13:33  Scan Result 0ml   Assessment & Plan:    1. rUTI's - Discussed my concern with the niece and the patient about her taking multiple antibiotics especially at the same time and that her symptoms may be secondary to GSM and she is colonized versus actual having recurrent UTIs - At this point I like to start on Hiprex  1 g twice daily, explaining that this is not an antibiotic, but it does make the urine hostile to bacterial growth and  therefore reducing UTI's - Encouraged her to start vaginal estrogen cream and we will help her find an affordable product - UA, likely colonized - urine culture pending, but will reassess her symptoms prior to starting an antibiotic  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 Mixed incontinence - Continue Vesicare  and oxybutynin  as prescribed by Dr. Gaston  Return for keep follow up with Dr. MacDiarmid .  These notes generated with voice recognition software. I apologize for typographical errors.  CLOTILDA HELON RIGGERS  [facility] [address]  [address] [place], [place] [address] [phone]

## 2023-08-28 NOTE — Patient Instructions (Signed)
 Do not take anymore trimethoprim  or Keflex .

## 2023-09-03 ENCOUNTER — Ambulatory Visit: Payer: Self-pay | Admitting: Urology

## 2023-09-03 DIAGNOSIS — N390 Urinary tract infection, site not specified: Secondary | ICD-10-CM

## 2023-09-03 LAB — CULTURE, URINE COMPREHENSIVE

## 2023-09-03 MED ORDER — FOSFOMYCIN TROMETHAMINE 3 G PO PACK: 3.0000 g | PACK | Freq: Once | ORAL | 0 refills | Status: AC

## 2023-09-10 DIAGNOSIS — Z794 Long term (current) use of insulin: Secondary | ICD-10-CM | POA: Diagnosis not present

## 2023-09-10 DIAGNOSIS — I10 Essential (primary) hypertension: Secondary | ICD-10-CM | POA: Diagnosis not present

## 2023-09-10 DIAGNOSIS — D508 Other iron deficiency anemias: Secondary | ICD-10-CM | POA: Diagnosis not present

## 2023-09-10 DIAGNOSIS — E781 Pure hyperglyceridemia: Secondary | ICD-10-CM | POA: Diagnosis not present

## 2023-09-10 DIAGNOSIS — E1140 Type 2 diabetes mellitus with diabetic neuropathy, unspecified: Secondary | ICD-10-CM | POA: Diagnosis not present

## 2023-09-17 DIAGNOSIS — E559 Vitamin D deficiency, unspecified: Secondary | ICD-10-CM | POA: Diagnosis not present

## 2023-09-17 DIAGNOSIS — E785 Hyperlipidemia, unspecified: Secondary | ICD-10-CM | POA: Diagnosis not present

## 2023-09-17 DIAGNOSIS — Z0000 Encounter for general adult medical examination without abnormal findings: Secondary | ICD-10-CM | POA: Diagnosis not present

## 2023-09-17 DIAGNOSIS — Z794 Long term (current) use of insulin: Secondary | ICD-10-CM | POA: Diagnosis not present

## 2023-09-17 DIAGNOSIS — E1140 Type 2 diabetes mellitus with diabetic neuropathy, unspecified: Secondary | ICD-10-CM | POA: Diagnosis not present

## 2023-09-17 DIAGNOSIS — I10 Essential (primary) hypertension: Secondary | ICD-10-CM | POA: Diagnosis not present

## 2023-09-17 DIAGNOSIS — Z1331 Encounter for screening for depression: Secondary | ICD-10-CM | POA: Diagnosis not present

## 2023-10-23 ENCOUNTER — Encounter: Payer: Self-pay | Admitting: Urology

## 2023-11-17 ENCOUNTER — Ambulatory Visit: Admitting: Urology

## 2023-11-19 NOTE — Progress Notes (Signed)
 11/20/2023 5:04 PM   Anna Ramos Jul 16, 1940 [id number]  Referring provider: Valora Agent, MD [address] [facility] [place],  [place] [address]  Urological history: 1. rUTI's - RUS (06/2023) Negative  - Jun 30, 2023, MUF -June 17, 2023, Klebsiella pneumoniae -June 11, 2023, greater than 2 organisms recovered -June 06, 2023, mixed urogenital flora -May 29, 2023, Klebsiella pneumonia -February 24th 2025, Klebsiella pneumonia -January 23rd 2025, E. coli -December 23, 2022 more than 3 organisms - Trimethoprim  100 mg daily, Keflex  250 mg daily and Macrobid  100 mg daily  2. Mixed incontinence - Myrbetriq  50 mg daily was cost prohibitive - Vesicare  5 mg daily and oxybutynin  XL 10 mg daily   3. 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 woman who presents today renal ultrasound results with her sister, [name].  Previous records reviewed.   At her visit on August 28, 2023, she has been seeing different providers for her symptoms and has been placed on 3 different prophylactic antibiotics.  She has been taking trimethoprim , Keflex  and Macrobid  daily in an effort to thwart her symptoms.  She has been having dysuria with cold chills.  She was not using the vaginal estrogen cream as it was too expensive.  Patient denies any modifying or aggravating factors.  Patient denies any recent UTI's, gross hematuria or suprapubic/flank pain.  Patient denies any fevers, chills, nausea or vomiting.  CATH UA is yellow clear, specific gravity 1.025, pH 6.0, 3+ protein, 1+ leukocyte, greater than 30 WBCs, 0-10 RBCs, 0-10 epithelial cells, mucus threads present many bacteria.  She grew out MDRO E.coli.  PVR 0 mL  Former smoker, recently quit.  Serum creatinine (05/2023) 1.21, eGFR 45.  She was given  fosfomycin for this.  Hemoglobin A1c (02/2023) 6.5.  She has been taking the Hiprex  1 g twice daily.  She is also taking Macrobid  100 mg daily.  She did not fill the vaginal estrogen cream as it was cost prohibitive.    She continues to have frequency, urgency and incontinence.  She is taking 10 mg oxybutynin  XL and 5 mg solifenacin  daily as directed by Dr. MacDiarmid.  Patient denies any modifying or aggravating factors.  Patient denies any recent UTI's, gross hematuria, dysuria or suprapubic/flank pain.  Patient denies any fevers, chills, nausea or vomiting.    She is having vaginal burning.    CATH UA yellow clear, 3+ protein, 1+ blood, 0-5 WBC's, 11-30 RBC's, 0-10 epithelial cells, granular casts present.    PMH: Past Medical History:  Diagnosis Date   Arthritis    Benign breast lumps    Chicken pox    Depression    Diabetes mellitus without complication (HCC)    GERD (gastroesophageal reflux disease)    Hyperlipidemia    Hypertension    Osteoporosis, post-menopausal     Surgical History: Past Surgical History:  Procedure Laterality Date   benign breast lump removals     BREAST BIOPSY Bilateral 1984   benign cysts   COLONOSCOPY  07/27/2013   COLONOSCOPY WITH PROPOFOL  N/A 12/31/2018   Procedure: COLONOSCOPY WITH PROPOFOL ;  Surgeon: Toledo, Ladell POUR, MD;  Location: [facility] ENDOSCOPY;  Service: Gastroenterology;  Laterality: N/A;   ECTOPIC PREGNANCY SURGERY  1974   open reduction tibial shaft fracture  07/16/2016  replacement total knee with resurfacing     TOTAL KNEE ARTHROPLASTY Left 04/20/2018   TUBAL LIGATION      Home Medications:  Allergies as of 11/20/2023   No Known Allergies      Medication List        Accurate as of November 20, 2023 11:59 PM. If you have any questions, ask your nurse or doctor.          STOP taking these medications    aspirin EC 81 MG tablet Stopped by: Shereen Marton   Cranberry 400 MG Caps Stopped by: Larone Kliethermes    gabapentin 300 MG capsule Commonly known as: NEURONTIN Stopped by: Meoshia Billing       TAKE these medications    acetaminophen 500 MG tablet Commonly known as: TYLENOL Take 1,000 mg by mouth.   Alpha-Lipoic Acid 600 MG Caps   CALCIUM CITRATE-VITAMIN D3 PO Take by mouth 2 (two) times daily.   cephALEXin  250 MG capsule Commonly known as: KEFLEX  Take 1 capsule (250 mg total) by mouth daily.   DULoxetine 60 MG capsule Commonly known as: CYMBALTA Take 60 mg by mouth daily.   estradiol  0.1 MG/GM vaginal cream Commonly known as: ESTRACE  VAGINAL Apply 0.5mg  (pea-sized amount)  just inside the vaginal introitus with a finger-tip every night for 30 days and then on Monday, Wednesday and Friday nights. Started by: CLOTILDA CORNWALL   Fingerstix Lancets Misc 1 each by Other route.   Flax Seed Oil 1000 MG Caps Take 1 capsule by mouth every morning.   GlucoCom Blood Glucose Monitor Devi See admin instructions.   hydrochlorothiazide 12.5 MG capsule Commonly known as: MICROZIDE Take 12.5 mg by mouth daily.   insulin NPH-regular Human (70-30) 100 UNIT/ML injection Inject 20 Units into the skin 2 (two) times daily before a meal.   lisinopril 40 MG tablet Commonly known as: ZESTRIL Take 40 mg by mouth daily.   metFORMIN 500 MG tablet Commonly known as: GLUCOPHAGE Take 500 mg by mouth 2 (two) times daily.   methenamine  1 g tablet Commonly known as: Hiprex  Take 1 tablet (1 g total) by mouth 2 (two) times daily with a meal.   nitrofurantoin  (macrocrystal-monohydrate) 100 MG capsule Commonly known as: MACROBID  Take 100 mg by mouth daily.   Omega 3 1000 MG Caps Take by mouth.   omeprazole 20 MG capsule Commonly known as: PRILOSEC Take 20 mg by mouth daily.   OneTouch Ultra Test test strip Generic drug: glucose blood 2 (two) times daily.   oxybutynin  10 MG 24 hr tablet Commonly known as: DITROPAN -XL Take 1 tablet (10 mg total) by mouth daily.   pravastatin 10 MG  tablet Commonly known as: PRAVACHOL Take 10 mg by mouth daily.   pregabalin 50 MG capsule Commonly known as: LYRICA Take 50 mg by mouth 3 (three) times daily.   solifenacin  5 MG tablet Commonly known as: VESICARE  Take 1 tablet (5 mg total) by mouth daily.   VITAMIN D-3 PO Take 1,000 mg by mouth daily.        Allergies: No Known Allergies  Family History: Family History  Problem Relation Age of Onset   Breast cancer Sister 49   Hypertension Sister    Diabetes Sister    Liver cancer Mother    Osteoporosis Mother    Lupus Mother    Hypertension Father    Diabetes Father    Congenital heart disease Father    Colon polyps Brother    Diabetes Brother  Stroke Brother    Diabetes Maternal Grandmother    Prostate cancer Maternal Grandfather    Diabetes Paternal Grandmother    Heart attack Paternal Grandmother    Diabetes Paternal Grandfather     Social History: See HPI for pertinent social history  ROS: Pertinent ROS in HPI  Physical Exam: BP (!) 166/85   Pulse 93   Ht 5' 2 (1.575 m)   Wt 225 lb (102.1 kg)   BMI 41.15 kg/m   Constitutional:  Well nourished. Alert and oriented, No acute distress. HEENT: [place] AT, moist mucus membranes.  Trachea midline Cardiovascular: No clubbing, cyanosis, or edema. Respiratory: Normal respiratory effort, no increased work of breathing. GU: No CVA tenderness.  No bladder fullness or masses.  Recession of labia minora, dry, pale vulvar vaginal mucosa and loss of mucosal ridges and folds.  Normal urethral meatus, no lesions, no prolapse, no discharge.   No urethral masses, tenderness and/or tenderness. No bladder fullness, tenderness or masses. Pale vagina mucosa, poor estrogen effect, no discharge, no lesions, fair pelvic support, grade I cystocele and no rectocele noted.  Anus and perineum are without rashes or lesions.    Neurologic: Grossly intact, no focal deficits, moving all 4 extremities. Psychiatric: Normal mood and affect.     Laboratory Data: See EPIC and HPI  I have reviewed the labs.   Pertinent Imaging: N/A  In and Out Catheterization  Patient is present today for a I & O catheterization due to irritative voiding symptoms. Patient was cleaned and prepped in a sterile fashion with betadine . A 14 FR cath was inserted no complications were noted , 250 ml of urine return was noted, urine was yellow clear in color. A clean urine sample was collected for UA. Bladder was drained  And catheter was removed with out difficulty.    Performed by: CLOTILDA CORNWALL, PA-C and Laymon Ned, CMA     Assessment & Plan:    1. 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 GSM - start applying vaginal estrogen cream every night for 30 days and then three nights weekly   3. Mixed incontinence - Continue Vesicare  and oxybutynin  as prescribed by Dr. Gaston  Return in about 1 month (around 12/20/2023) for symptom recheck .  These notes generated with voice recognition software. I apologize for typographical errors.  CLOTILDA CORNWALL RIGGERS  [facility] [address]  [address] [place], [place] [address] [phone]

## 2023-11-20 ENCOUNTER — Ambulatory Visit: Admitting: Urology

## 2023-11-20 ENCOUNTER — Encounter: Payer: Self-pay | Admitting: Urology

## 2023-11-20 VITALS — BP 166/85 | HR 93 | Ht 62.0 in | Wt 225.0 lb

## 2023-11-20 DIAGNOSIS — N952 Postmenopausal atrophic vaginitis: Secondary | ICD-10-CM | POA: Diagnosis not present

## 2023-11-20 DIAGNOSIS — N3946 Mixed incontinence: Secondary | ICD-10-CM

## 2023-11-20 DIAGNOSIS — N390 Urinary tract infection, site not specified: Secondary | ICD-10-CM | POA: Diagnosis not present

## 2023-11-20 LAB — URINALYSIS, COMPLETE
Bilirubin, UA: NEGATIVE
Bilirubin, UA: NEGATIVE
Glucose, UA: NEGATIVE
Glucose, UA: NEGATIVE
Ketones, UA: NEGATIVE
Ketones, UA: NEGATIVE
Leukocytes,UA: NEGATIVE
Leukocytes,UA: NEGATIVE
Nitrite, UA: NEGATIVE
Nitrite, UA: NEGATIVE
Specific Gravity, UA: 1.01 (ref 1.005–1.030)
Specific Gravity, UA: 1.015 (ref 1.005–1.030)
Urobilinogen, Ur: 0.2 mg/dL (ref 0.2–1.0)
Urobilinogen, Ur: 0.2 mg/dL (ref 0.2–1.0)
pH, UA: 6 (ref 5.0–7.5)
pH, UA: 6 (ref 5.0–7.5)

## 2023-11-20 LAB — MICROSCOPIC EXAMINATION
Bacteria, UA: NONE SEEN
Epithelial Cells (non renal): >10 /HPF — AB (ref 0–10)

## 2023-11-20 MED ORDER — ESTRADIOL 0.1 MG/GM VA CREA: TOPICAL_CREAM | VAGINAL | 12 refills | Status: DC

## 2023-12-22 NOTE — Progress Notes (Unsigned)
 12/23/2023 4:13 PM   Anna Ramos [birth year] [id number]  Referring provider: Valora Lynwood FALCON, MD [address] [place],  [place] [address]  Urological history: 1. rUTI's - RUS (06/2023) Negative  - Jun 30, 2023, MUF -June 17, 2023, Klebsiella pneumoniae -June 11, 2023, greater than 2 organisms recovered -June 06, 2023, mixed urogenital flora -May 29, 2023, Klebsiella pneumonia -February 24th 2025, Klebsiella pneumonia -January 23rd 2025, E. coli -December 23, 2022 more than 3 organisms - Trimethoprim  100 mg daily, Keflex  250 mg daily and Macrobid  100 mg daily  2. Mixed incontinence - Myrbetriq  50 mg daily was cost prohibitive - Vesicare  5 mg daily and oxybutynin  XL 10 mg daily   3. 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  HPI: Anna Ramos is a 82 y.o. woman who presents today renal ultrasound results with her sister, [name].  Previous records reviewed.   When I saw her a month ago, she was having frequency, urgency and incontinence.  She was taking the oxybutynin  XL 10 mg and the solifenacin  5 mg daily.  She had also been taking the Hiprex  1 g twice daily and Macrobid  100 mg daily.  A catheterized UA was unimpressive.  She was started on vaginal estrogen cream nightly for 30 days.  They are having (1 to 7) or (8 or more) daytime voids,  they are having nocturia (1-2) or (3 or more) and urgency is (none, mild, strong, severe).   They are having (stress, urge or mixed incontinence.)    they are having urinary leakage (1-2 times weekly, 3 or more times weekly, 1-2 times daily and 3 or more times daily) They are using absorbent products for leakage (no, sometimes, always )   the type of products they use are (panty liners, absorbant pads, depends) *** daily.  They are not limiting fluids.  They are not engaging in  toilet mapping  ***  UA ***  PVR ***  PMH: Past Medical History:  Diagnosis Date   Arthritis    Benign breast lumps    Chicken pox    Depression    Diabetes mellitus without complication (HCC)    GERD (gastroesophageal reflux disease)    Hyperlipidemia    Hypertension    Osteoporosis, post-menopausal     Surgical History: Past Surgical History:  Procedure Laterality Date   benign breast lump removals     BREAST BIOPSY Bilateral 1984   benign cysts   COLONOSCOPY  07/27/2013   COLONOSCOPY WITH PROPOFOL  N/A 12/31/2018   Procedure: COLONOSCOPY WITH PROPOFOL ;  Surgeon: Toledo, Ladell POUR, MD;  Location: [facility] ENDOSCOPY;  Service: Gastroenterology;  Laterality: N/A;   ECTOPIC PREGNANCY SURGERY  1974   open reduction tibial shaft fracture  07/16/2016   replacement total knee with resurfacing     TOTAL KNEE ARTHROPLASTY Left 04/20/2018   TUBAL LIGATION      Home Medications:  Allergies as of 12/23/2023   No Known Allergies      Medication List        Accurate as of December 22, 2023  4:13 PM. If you have any questions, ask your nurse or doctor.          acetaminophen 500 MG tablet Commonly known as: TYLENOL Take 1,000 mg by mouth.   Alpha-Lipoic Acid 600 MG Caps   CALCIUM CITRATE-VITAMIN D3 PO Take by  mouth 2 (two) times daily.   cephALEXin  250 MG capsule Commonly known as: KEFLEX  Take 1 capsule (250 mg total) by mouth daily.   DULoxetine 60 MG capsule Commonly known as: CYMBALTA Take 60 mg by mouth daily.   estradiol  0.1 MG/GM vaginal cream Commonly known as: ESTRACE  VAGINAL Apply 0.5mg  (pea-sized amount)  just inside the vaginal introitus with a finger-tip every night for 30 days and then on Monday, Wednesday and Friday nights.   Fingerstix Lancets Misc 1 each by Other route.   Flax Seed Oil 1000 MG Caps Take 1 capsule by mouth every morning.   GlucoCom Blood Glucose Monitor Devi See admin instructions.   hydrochlorothiazide 12.5 MG  capsule Commonly known as: MICROZIDE Take 12.5 mg by mouth daily.   insulin NPH-regular Human (70-30) 100 UNIT/ML injection Inject 20 Units into the skin 2 (two) times daily before a meal.   lisinopril 40 MG tablet Commonly known as: ZESTRIL Take 40 mg by mouth daily.   metFORMIN 500 MG tablet Commonly known as: GLUCOPHAGE Take 500 mg by mouth 2 (two) times daily.   methenamine  1 g tablet Commonly known as: Hiprex  Take 1 tablet (1 g total) by mouth 2 (two) times daily with a meal.   nitrofurantoin  (macrocrystal-monohydrate) 100 MG capsule Commonly known as: MACROBID  Take 100 mg by mouth daily.   Omega 3 1000 MG Caps Take by mouth.   omeprazole 20 MG capsule Commonly known as: PRILOSEC Take 20 mg by mouth daily.   OneTouch Ultra Test test strip Generic drug: glucose blood 2 (two) times daily.   oxybutynin  10 MG 24 hr tablet Commonly known as: DITROPAN -XL Take 1 tablet (10 mg total) by mouth daily.   pravastatin 10 MG tablet Commonly known as: PRAVACHOL Take 10 mg by mouth daily.   pregabalin 50 MG capsule Commonly known as: LYRICA Take 50 mg by mouth 3 (three) times daily.   solifenacin  5 MG tablet Commonly known as: VESICARE  Take 1 tablet (5 mg total) by mouth daily.   VITAMIN D-3 PO Take 1,000 mg by mouth daily.        Allergies: No Known Allergies  Family History: Family History  Problem Relation Age of Onset   Breast cancer Sister 6   Hypertension Sister    Diabetes Sister    Liver cancer Mother    Osteoporosis Mother    Lupus Mother    Hypertension Father    Diabetes Father    Congenital heart disease Father    Colon polyps Brother    Diabetes Brother    Stroke Brother    Diabetes Maternal Grandmother    Prostate cancer Maternal Grandfather    Diabetes Paternal Grandmother    Heart attack Paternal Grandmother    Diabetes Paternal Grandfather     Social History: See HPI for pertinent social history  ROS: Pertinent ROS in  HPI  Physical Exam: There were no vitals taken for this visit.  Constitutional:  Well nourished. Alert and oriented, No acute distress. HEENT: [place] AT, moist mucus membranes.  Trachea midline Cardiovascular: No clubbing, cyanosis, or edema. Respiratory: Normal respiratory effort, no increased work of breathing. Neurologic: Grossly intact, no focal deficits, moving all 4 extremities. Psychiatric: Normal mood and affect.    Laboratory Data: See EPIC and HPI  I have reviewed the labs.   Pertinent Imaging: N/A  Assessment & Plan:    1. GSM - start applying vaginal estrogen cream every night for 30 days and then three nights weekly  2. Mixed incontinence - Continue Vesicare  and oxybutynin  as prescribed by Dr. Gaston  No follow-ups on file.  These notes generated with voice recognition software. I apologize for typographical errors.  CLOTILDA HELON RIGGERS  [facility] [address]  [address] [place], [place] [address] [phone]

## 2023-12-23 ENCOUNTER — Ambulatory Visit: Admitting: Urology

## 2023-12-23 ENCOUNTER — Encounter: Payer: Self-pay | Admitting: Urology

## 2023-12-23 VITALS — BP 160/74 | HR 59 | Ht 61.0 in | Wt 220.0 lb

## 2023-12-23 DIAGNOSIS — N952 Postmenopausal atrophic vaginitis: Secondary | ICD-10-CM

## 2023-12-23 DIAGNOSIS — N390 Urinary tract infection, site not specified: Secondary | ICD-10-CM

## 2023-12-23 DIAGNOSIS — N3946 Mixed incontinence: Secondary | ICD-10-CM

## 2024-01-15 ENCOUNTER — Other Ambulatory Visit: Payer: Self-pay

## 2024-01-15 DIAGNOSIS — N3281 Overactive bladder: Secondary | ICD-10-CM

## 2024-01-15 MED ORDER — SOLIFENACIN SUCCINATE 5 MG PO TABS: 5.0000 mg | ORAL_TABLET | Freq: Every day | ORAL | 2 refills | Status: AC

## 2024-01-19 ENCOUNTER — Ambulatory Visit: Admitting: Urology

## 2024-01-20 DIAGNOSIS — M79661 Pain in right lower leg: Secondary | ICD-10-CM | POA: Diagnosis not present

## 2024-01-20 DIAGNOSIS — Z87891 Personal history of nicotine dependence: Secondary | ICD-10-CM | POA: Diagnosis not present

## 2024-01-20 DIAGNOSIS — M79662 Pain in left lower leg: Secondary | ICD-10-CM | POA: Diagnosis not present

## 2024-01-20 DIAGNOSIS — Z7984 Long term (current) use of oral hypoglycemic drugs: Secondary | ICD-10-CM | POA: Diagnosis not present

## 2024-01-20 DIAGNOSIS — S0990XA Unspecified injury of head, initial encounter: Secondary | ICD-10-CM | POA: Diagnosis not present

## 2024-01-20 DIAGNOSIS — Z7902 Long term (current) use of antithrombotics/antiplatelets: Secondary | ICD-10-CM | POA: Diagnosis not present

## 2024-01-20 DIAGNOSIS — E119 Type 2 diabetes mellitus without complications: Secondary | ICD-10-CM | POA: Diagnosis not present

## 2024-01-21 DIAGNOSIS — R32 Unspecified urinary incontinence: Secondary | ICD-10-CM | POA: Diagnosis not present

## 2024-01-21 DIAGNOSIS — M79605 Pain in left leg: Secondary | ICD-10-CM | POA: Diagnosis not present

## 2024-01-21 DIAGNOSIS — E861 Hypovolemia: Secondary | ICD-10-CM | POA: Diagnosis not present

## 2024-01-21 DIAGNOSIS — K529 Noninfective gastroenteritis and colitis, unspecified: Secondary | ICD-10-CM | POA: Diagnosis not present

## 2024-01-21 DIAGNOSIS — R001 Bradycardia, unspecified: Secondary | ICD-10-CM | POA: Diagnosis not present

## 2024-01-21 DIAGNOSIS — M170 Bilateral primary osteoarthritis of knee: Secondary | ICD-10-CM | POA: Diagnosis not present

## 2024-01-21 DIAGNOSIS — Z96649 Presence of unspecified artificial hip joint: Secondary | ICD-10-CM | POA: Diagnosis not present

## 2024-01-21 DIAGNOSIS — I160 Hypertensive urgency: Secondary | ICD-10-CM | POA: Diagnosis not present

## 2024-01-21 DIAGNOSIS — N390 Urinary tract infection, site not specified: Secondary | ICD-10-CM | POA: Diagnosis not present

## 2024-01-21 DIAGNOSIS — R296 Repeated falls: Secondary | ICD-10-CM | POA: Diagnosis not present

## 2024-01-21 DIAGNOSIS — I129 Hypertensive chronic kidney disease with stage 1 through stage 4 chronic kidney disease, or unspecified chronic kidney disease: Secondary | ICD-10-CM | POA: Diagnosis not present

## 2024-01-21 DIAGNOSIS — E870 Hyperosmolality and hypernatremia: Secondary | ICD-10-CM | POA: Diagnosis not present

## 2024-01-21 DIAGNOSIS — I441 Atrioventricular block, second degree: Secondary | ICD-10-CM | POA: Diagnosis not present

## 2024-01-21 DIAGNOSIS — Z66 Do not resuscitate: Secondary | ICD-10-CM | POA: Diagnosis not present

## 2024-01-21 DIAGNOSIS — G8929 Other chronic pain: Secondary | ICD-10-CM | POA: Diagnosis not present

## 2024-01-21 DIAGNOSIS — N179 Acute kidney failure, unspecified: Secondary | ICD-10-CM | POA: Diagnosis not present

## 2024-01-21 DIAGNOSIS — K219 Gastro-esophageal reflux disease without esophagitis: Secondary | ICD-10-CM | POA: Diagnosis not present

## 2024-01-21 DIAGNOSIS — E1122 Type 2 diabetes mellitus with diabetic chronic kidney disease: Secondary | ICD-10-CM | POA: Diagnosis not present

## 2024-01-21 DIAGNOSIS — R197 Diarrhea, unspecified: Secondary | ICD-10-CM | POA: Diagnosis not present

## 2024-01-21 DIAGNOSIS — E872 Acidosis, unspecified: Secondary | ICD-10-CM | POA: Diagnosis not present

## 2024-01-21 DIAGNOSIS — I10 Essential (primary) hypertension: Secondary | ICD-10-CM | POA: Diagnosis not present

## 2024-01-21 DIAGNOSIS — E1140 Type 2 diabetes mellitus with diabetic neuropathy, unspecified: Secondary | ICD-10-CM | POA: Diagnosis not present

## 2024-01-21 DIAGNOSIS — M19072 Primary osteoarthritis, left ankle and foot: Secondary | ICD-10-CM | POA: Diagnosis not present

## 2024-01-21 DIAGNOSIS — M1712 Unilateral primary osteoarthritis, left knee: Secondary | ICD-10-CM | POA: Diagnosis not present

## 2024-01-28 ENCOUNTER — Other Ambulatory Visit: Payer: Self-pay

## 2024-01-29 DIAGNOSIS — M79671 Pain in right foot: Secondary | ICD-10-CM | POA: Diagnosis not present

## 2024-01-29 DIAGNOSIS — R2689 Other abnormalities of gait and mobility: Secondary | ICD-10-CM | POA: Diagnosis not present

## 2024-01-29 DIAGNOSIS — M25561 Pain in right knee: Secondary | ICD-10-CM | POA: Diagnosis not present

## 2024-01-29 DIAGNOSIS — R262 Difficulty in walking, not elsewhere classified: Secondary | ICD-10-CM | POA: Diagnosis not present

## 2024-01-29 DIAGNOSIS — M79604 Pain in right leg: Secondary | ICD-10-CM | POA: Diagnosis not present

## 2024-01-29 DIAGNOSIS — M25562 Pain in left knee: Secondary | ICD-10-CM | POA: Diagnosis not present

## 2024-01-29 DIAGNOSIS — M79672 Pain in left foot: Secondary | ICD-10-CM | POA: Diagnosis not present

## 2024-01-29 DIAGNOSIS — G8929 Other chronic pain: Secondary | ICD-10-CM | POA: Diagnosis not present

## 2024-01-29 DIAGNOSIS — M79605 Pain in left leg: Secondary | ICD-10-CM | POA: Diagnosis not present

## 2024-02-04 ENCOUNTER — Other Ambulatory Visit: Payer: Self-pay

## 2024-02-04 ENCOUNTER — Other Ambulatory Visit: Payer: Self-pay | Admitting: Urology

## 2024-02-04 DIAGNOSIS — N952 Postmenopausal atrophic vaginitis: Secondary | ICD-10-CM

## 2024-02-04 MED ORDER — ESTRADIOL 0.01 % VA CREA: TOPICAL_CREAM | VAGINAL | 12 refills | Status: DC

## 2024-02-04 MED ORDER — ESTRADIOL 0.01 % VA CREA: TOPICAL_CREAM | VAGINAL | 12 refills | Status: AC

## 2024-03-23 ENCOUNTER — Telehealth: Payer: Self-pay

## 2024-03-23 DIAGNOSIS — N390 Urinary tract infection, site not specified: Secondary | ICD-10-CM

## 2024-03-23 MED ORDER — METHENAMINE HIPPURATE 1 G PO TABS: 1.0000 g | ORAL_TABLET | Freq: Two times a day (BID) | ORAL | 2 refills | Status: AC

## 2024-03-23 NOTE — Telephone Encounter (Signed)
 Patient called the triage line stating she has new insurance and will need a new prescription for Hiprex  sent to [facility] in [place]. Patient stated she will call the office to reschedule her follow-up appointment, which she had to cancel due to snow.

## 2024-03-24 ENCOUNTER — Ambulatory Visit: Admitting: Urology
